# Patient Record
Sex: Female | Born: 1990 | Race: Black or African American | Hispanic: No | Marital: Single | Smoking: Current every day smoker
Health system: Southern US, Community
[De-identification: ages and names within clinical notes are randomized; demographics above are authoritative.]

## PROBLEM LIST (undated history)

## (undated) DIAGNOSIS — J45909 Unspecified asthma, uncomplicated: Secondary | ICD-10-CM

## (undated) HISTORY — PX: APPENDECTOMY: SHX54

## (undated) HISTORY — PX: MOUTH SURGERY: SHX715

---
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
## 2009-05-17 ENCOUNTER — Emergency Department (HOSPITAL_COMMUNITY): Admission: EM | Admit: 2009-05-17 | Discharge: 2009-05-17 | Payer: Self-pay | Admitting: Emergency Medicine

## 2010-01-19 ENCOUNTER — Emergency Department (HOSPITAL_COMMUNITY): Admission: EM | Admit: 2010-01-19 | Discharge: 2010-01-19 | Payer: Self-pay | Admitting: Emergency Medicine

## 2010-03-30 ENCOUNTER — Emergency Department (HOSPITAL_COMMUNITY)
Admission: EM | Admit: 2010-03-30 | Discharge: 2010-03-30 | Payer: Self-pay | Source: Home / Self Care | Admitting: Emergency Medicine

## 2010-06-17 LAB — URINALYSIS, ROUTINE W REFLEX MICROSCOPIC
Ketones, ur: NEGATIVE mg/dL
Leukocytes, UA: NEGATIVE
Nitrite: NEGATIVE
Protein, ur: NEGATIVE mg/dL

## 2010-06-17 LAB — URINE MICROSCOPIC-ADD ON

## 2010-06-17 LAB — DIFFERENTIAL
Basophils Absolute: 0.1 10*3/uL (ref 0.0–0.1)
Eosinophils Absolute: 0.1 10*3/uL (ref 0.0–0.7)
Eosinophils Relative: 2 % (ref 0–5)

## 2010-06-17 LAB — CBC
Hemoglobin: 12.7 g/dL (ref 12.0–15.0)
MCH: 29.3 pg (ref 26.0–34.0)
RBC: 4.34 MIL/uL (ref 3.87–5.11)

## 2010-06-17 LAB — COMPREHENSIVE METABOLIC PANEL
ALT: 15 U/L (ref 0–35)
AST: 23 U/L (ref 0–37)
CO2: 26 mEq/L (ref 19–32)
Chloride: 107 mEq/L (ref 96–112)
GFR calc Af Amer: >60 mL/min (ref >60)
GFR calc non Af Amer: >60 mL/min (ref >60)
Sodium: 142 mEq/L (ref 135–145)
Total Bilirubin: 0.5 mg/dL (ref 0.3–1.2)

## 2010-06-17 LAB — POCT PREGNANCY, URINE: Preg Test, Ur: NEGATIVE

## 2010-06-19 LAB — DIFFERENTIAL
Basophils Absolute: 0 10*3/uL (ref 0.0–0.1)
Eosinophils Relative: 0 % (ref 0–5)
Lymphocytes Relative: 11 % — ABNORMAL LOW (ref 12–46)
Lymphs Abs: 1.2 10*3/uL (ref 0.7–4.0)
Monocytes Absolute: 0.4 10*3/uL (ref 0.1–1.0)
Monocytes Relative: 4 % (ref 3–12)

## 2010-06-19 LAB — CBC
HCT: 39.1 % (ref 36.0–46.0)
Hemoglobin: 13.2 g/dL (ref 12.0–15.0)
MCV: 86.7 fL (ref 78.0–100.0)
RDW: 13 % (ref 11.5–15.5)
WBC: 10.2 10*3/uL (ref 4.0–10.5)

## 2010-06-19 LAB — URINALYSIS, ROUTINE W REFLEX MICROSCOPIC
Bilirubin Urine: NEGATIVE
Glucose, UA: NEGATIVE mg/dL
Hgb urine dipstick: NEGATIVE
Specific Gravity, Urine: 1.01 (ref 1.005–1.030)
pH: 8.5 — ABNORMAL HIGH (ref 5.0–8.0)

## 2010-06-19 LAB — BASIC METABOLIC PANEL
BUN: 8 mg/dL (ref 6–23)
Chloride: 106 mEq/L (ref 96–112)
Glucose, Bld: 110 mg/dL — ABNORMAL HIGH (ref 70–99)
Potassium: 4.2 mEq/L (ref 3.5–5.1)

## 2010-08-11 ENCOUNTER — Emergency Department (HOSPITAL_COMMUNITY)
Admission: EM | Admit: 2010-08-11 | Discharge: 2010-08-11 | Disposition: A | Discharge status: Home / Self Care | Payer: Self-pay | Attending: Emergency Medicine | Admitting: Emergency Medicine

## 2010-08-11 DIAGNOSIS — IMO0001 Reserved for inherently not codable concepts without codable children: Secondary | ICD-10-CM | POA: Insufficient documentation

## 2010-08-11 DIAGNOSIS — M549 Dorsalgia, unspecified: Secondary | ICD-10-CM | POA: Insufficient documentation

## 2011-01-08 LAB — HEPATIC FUNCTION PANEL
ALT: 15
AST: 22
Albumin: 4.2
Bilirubin, Direct: 0.1

## 2011-01-08 LAB — DIFFERENTIAL
Basophils Absolute: 0.1
Basophils Relative: 0
Lymphocytes Relative: 6 — ABNORMAL LOW
Monocytes Absolute: 0.7
Monocytes Relative: 4
Neutro Abs: 15.1 — ABNORMAL HIGH
Neutrophils Relative %: 90 — ABNORMAL HIGH

## 2011-01-08 LAB — BASIC METABOLIC PANEL
Calcium: 9.6
Creatinine, Ser: 0.82
GFR calc Af Amer: >60
GFR calc non Af Amer: >60
Sodium: 138

## 2011-01-08 LAB — URINALYSIS, ROUTINE W REFLEX MICROSCOPIC
Glucose, UA: NEGATIVE
Leukocytes, UA: NEGATIVE
Specific Gravity, Urine: >1.03 — ABNORMAL HIGH
pH: 6

## 2011-01-08 LAB — PREGNANCY, URINE: Preg Test, Ur: NEGATIVE

## 2011-01-08 LAB — URINE MICROSCOPIC-ADD ON

## 2011-01-08 LAB — CBC
Hemoglobin: 13
MCHC: 33.5
RBC: 4.56
RDW: 12.6

## 2012-04-07 ENCOUNTER — Encounter (HOSPITAL_COMMUNITY): Payer: Self-pay | Admitting: *Deleted

## 2012-04-07 ENCOUNTER — Inpatient Hospital Stay (HOSPITAL_COMMUNITY)
Admission: EM | Admit: 2012-04-07 | Discharge: 2012-04-10 | DRG: 343 | Disposition: A | Discharge status: Home / Self Care | Payer: MEDICAID | Attending: General Surgery | Admitting: General Surgery

## 2012-04-07 ENCOUNTER — Emergency Department (HOSPITAL_COMMUNITY): Payer: Self-pay

## 2012-04-07 ENCOUNTER — Emergency Department (HOSPITAL_COMMUNITY)
Admission: EM | Admit: 2012-04-07 | Discharge: 2012-04-07 | Disposition: A | Discharge status: Home / Self Care | Payer: Self-pay | Attending: Emergency Medicine | Admitting: Emergency Medicine

## 2012-04-07 DIAGNOSIS — Z3202 Encounter for pregnancy test, result negative: Secondary | ICD-10-CM | POA: Insufficient documentation

## 2012-04-07 DIAGNOSIS — F17200 Nicotine dependence, unspecified, uncomplicated: Secondary | ICD-10-CM | POA: Insufficient documentation

## 2012-04-07 DIAGNOSIS — R109 Unspecified abdominal pain: Secondary | ICD-10-CM

## 2012-04-07 DIAGNOSIS — K3580 Unspecified acute appendicitis: Principal | ICD-10-CM | POA: Diagnosis present

## 2012-04-07 DIAGNOSIS — R1011 Right upper quadrant pain: Secondary | ICD-10-CM | POA: Insufficient documentation

## 2012-04-07 DIAGNOSIS — R112 Nausea with vomiting, unspecified: Secondary | ICD-10-CM | POA: Insufficient documentation

## 2012-04-07 LAB — CBC WITH DIFFERENTIAL/PLATELET
Basophils Relative: 0 % (ref 0–1)
Hemoglobin: 10.5 g/dL — ABNORMAL LOW (ref 12.0–15.0)
Lymphs Abs: 0.5 10*3/uL — ABNORMAL LOW (ref 0.7–4.0)
MCHC: 33.9 g/dL (ref 30.0–36.0)
Monocytes Relative: 5 % (ref 3–12)
Neutro Abs: 14 10*3/uL — ABNORMAL HIGH (ref 1.7–7.7)
Neutrophils Relative %: 92 % — ABNORMAL HIGH (ref 43–77)
RBC: 3.64 MIL/uL — ABNORMAL LOW (ref 3.87–5.11)

## 2012-04-07 LAB — URINALYSIS, ROUTINE W REFLEX MICROSCOPIC
Bilirubin Urine: NEGATIVE
Glucose, UA: NEGATIVE mg/dL
Ketones, ur: NEGATIVE mg/dL
pH: 6 (ref 5.0–8.0)

## 2012-04-07 LAB — CBC
Hemoglobin: 12.8 g/dL (ref 12.0–15.0)
MCV: 85.2 fL (ref 78.0–100.0)
Platelets: 236 10*3/uL (ref 150–400)
RBC: 4.38 MIL/uL (ref 3.87–5.11)
WBC: 10.7 10*3/uL — ABNORMAL HIGH (ref 4.0–10.5)

## 2012-04-07 LAB — COMPREHENSIVE METABOLIC PANEL
ALT: 9 U/L (ref 0–35)
AST: 19 U/L (ref 0–37)
CO2: 26 mEq/L (ref 19–32)
Chloride: 102 mEq/L (ref 96–112)
Creatinine, Ser: 0.63 mg/dL (ref 0.50–1.10)
GFR calc non Af Amer: >90 mL/min (ref >90)
Sodium: 137 mEq/L (ref 135–145)
Total Bilirubin: 0.7 mg/dL (ref 0.3–1.2)

## 2012-04-07 LAB — URINE MICROSCOPIC-ADD ON

## 2012-04-07 MED ORDER — IOHEXOL 300 MG/ML  SOLN
100.0000 mL | Freq: Once | INTRAMUSCULAR | Status: AC | PRN
  Administered 2012-04-07: 100 mL via INTRAVENOUS

## 2012-04-07 MED ORDER — ONDANSETRON HCL 4 MG/2ML IJ SOLN
4.0000 mg | Freq: Once | INTRAMUSCULAR | Status: AC
  Administered 2012-04-07: 4 mg via INTRAVENOUS
  Filled 2012-04-07: qty 2

## 2012-04-07 MED ORDER — MORPHINE SULFATE 4 MG/ML IJ SOLN
6.0000 mg | Freq: Once | INTRAMUSCULAR | Status: AC
  Administered 2012-04-07: 6 mg via INTRAVENOUS
  Filled 2012-04-07: qty 2

## 2012-04-07 MED ORDER — SODIUM CHLORIDE 0.9 % IV SOLN
1000.0000 mL | Freq: Once | INTRAVENOUS | Status: AC
  Administered 2012-04-07: 1000 mL via INTRAVENOUS

## 2012-04-07 MED ORDER — PROMETHAZINE HCL 25 MG PO TABS: 25.0000 mg | ORAL_TABLET | Freq: Four times a day (QID) | ORAL | Status: DC | PRN

## 2012-04-07 MED ORDER — SODIUM CHLORIDE 0.9 % IV SOLN
3.0000 g | Freq: Four times a day (QID) | INTRAVENOUS | Status: DC
  Administered 2012-04-08 – 2012-04-09 (×8): 3 g via INTRAVENOUS
  Filled 2012-04-07 (×20): qty 3

## 2012-04-07 MED ORDER — SODIUM CHLORIDE 0.9 % IV SOLN
1000.0000 mL | INTRAVENOUS | Status: DC
  Administered 2012-04-07: 1000 mL via INTRAVENOUS

## 2012-04-07 MED ORDER — SODIUM CHLORIDE 0.9 % IV SOLN: 1000.0000 mL | INTRAVENOUS | Status: DC

## 2012-04-07 MED ORDER — HYDROCODONE-ACETAMINOPHEN 5-325 MG PO TABS: 1.0000 | ORAL_TABLET | ORAL | Status: DC | PRN

## 2012-04-07 MED ORDER — SODIUM CHLORIDE 0.9 % IV SOLN: Freq: Once | INTRAVENOUS | Status: AC

## 2012-04-07 NOTE — ED Notes (Signed)
abd pain , vomiting since last pm.  No diarrhea.

## 2012-04-07 NOTE — ED Provider Notes (Addendum)
History  This chart was scribed for Lyanne Co, MD by Sofie Rower, ED Scribe. The patient was seen in room APA12/[facility]12 and the patient's care was started at 8:41PM    CSN: [id number]  Arrival date & time 04/07/12  2026   First MD Initiated Contact with Patient 04/07/12 2041      Chief Complaint  Patient presents with  . Emesis     The history is provided by the patient.    Stacy Oconnell is a 22 y.o. female ,  returning to the Emergency Department after discharge from the ER earlier today.  She states she went home and attempted to try and eat something and became nauseated in her abdominal pain continued and that she return to the emergency department for evaluation.  Her abdominal pain began earlier today she's had nausea and vomiting since then.  No diarrhea or urinary symptoms.  Her symptoms are mild to moderate in severity this time.  She's never had pain like this before.       History reviewed. No pertinent past medical history.  History reviewed. No pertinent past surgical history.  History reviewed. No pertinent family history.  History  Substance Use Topics  . Smoking status: Current Every Day Smoker  . Smokeless tobacco: Not on file  . Alcohol Use: No    OB History    Grav Para Term Preterm Abortions TAB SAB Ect Mult Living                  Review of Systems  10 Systems reviewed and all are negative for acute change except as noted in the HPI.    Allergies  Review of patient's allergies indicates no known allergies.  Home Medications   Current Outpatient Rx  Name  Route  Sig  Dispense  Refill  . HYDROCODONE-ACETAMINOPHEN 5-325 MG PO TABS   Oral   Take 1 tablet by mouth every 4 (four) hours as needed for pain.   12 tablet   0   . IBUPROFEN 200 MG PO TABS   Oral   Take 800 mg by mouth every 6 (six) hours as needed. Pain         . PROMETHAZINE HCL 25 MG PO TABS   Oral   Take 1 tablet (25 mg total) by mouth every 6 (six) hours as needed  for nausea.   12 tablet   0     BP 92/55  Pulse 79  Temp 98.3 F (36.8 C) (Oral)  Resp 20  Ht 5\' 5"  (1.651 m)  Wt 100 lb (45.36 kg)  BMI 16.64 kg/m2  SpO2 100%  LMP 04/05/2012  Physical Exam  Nursing note and vitals reviewed. Constitutional: She is oriented to person, place, and time. She appears well-developed and well-nourished. No distress.  HENT:  Head: Normocephalic and atraumatic.  Eyes: EOM are normal.  Neck: Normal range of motion.  Cardiovascular: Normal rate, regular rhythm and normal heart sounds.   Pulmonary/Chest: Effort normal and breath sounds normal.  Abdominal: Soft. She exhibits no distension. There is tenderness in the right upper quadrant.       Mild periumbilical tenderness without guarding or rebound  Musculoskeletal: Normal range of motion.  Neurological: She is alert and oriented to person, place, and time.  Skin: Skin is warm and dry.  Psychiatric: She has a normal mood and affect. Judgment normal.    ED Course  Procedures (including critical care time)  DIAGNOSTIC STUDIES: Oxygen Saturation is 100%  on room air, normal by my interpretation.    COORDINATION OF CARE:  9:07 PM- Treatment plan discussed with patient. Pt agrees with treatment.      Labs Reviewed  CBC WITH DIFFERENTIAL - Abnormal; Notable for the following:    WBC 15.3 (*)     RBC 3.64 (*)     Hemoglobin 10.5 (*)  DELTA CHECK NOTED   HCT 31.0 (*)     Neutrophils Relative 92 (*)     Neutro Abs 14.0 (*)     Lymphocytes Relative 3 (*)     Lymphs Abs 0.5 (*)     All other components within normal limits   Ct Abdomen Pelvis W Contrast  04/07/2012  *RADIOLOGY REPORT*  Clinical Data: Vomiting, abdominal pain.  CT ABDOMEN AND PELVIS WITH CONTRAST  Technique:  Multidetector CT imaging of the abdomen and pelvis was performed following the standard protocol during bolus administration of intravenous contrast.  Contrast: OMNIPAQUE IOHEXOL 300 MG/ML  SOLN  Comparison: 12/16/2007   Findings: Lung bases are clear.  No effusions.  Heart is normal size.  There is mild periportal edema within the liver.  This is nonspecific but can be seen with inflammatory processes such as hepatitis.  No focal abnormality in the liver.  Gallbladder, stomach, spleen, pancreas, adrenals and kidneys are unremarkable. No hydronephrosis.  Uterus, adnexa and urinary bladder grossly unremarkable.  Trace free fluid in the pelvis, likely physiologic. Large and small bowel are grossly unremarkable.  Appendix is visualized. The proximal appendix is decompressed.  Mid to distal appendix is borderline in diameter at 7 to 8 mm, best seen on the coronal images.  No inflammatory changes around the appendix, but cannot exclude early appendicitis.  Mildly prominent right lower quadrant mesenteric lymph nodes.  Aorta is normal caliber.  No acute bony abnormality.  IMPRESSION: Mildly prominent mid to distal appendix.  Cannot exclude early appendicitis.  No current inflammatory change around the appendix. Recommend clinical correlation.  Mild periportal edema within the liver, a nonspecific finding. This can be related to inflammatory processes such as hepatitis. Recommend clinical correlation.   Original Report Authenticated By: Charlett Nose, M.D.    I personally reviewed the imaging tests through PACS system I reviewed available ER/hospitalization records through the EMR   No diagnosis found.    MDM  11:34 PM The patient's CT scan is concerning for early appendicitis.  Her white count is rising from 10,000 earlier today to 15,000 now.  I will ask the general surgeon to evaluate the patient in the emergency department.    I personally performed the services described in this documentation, which was scribed in my presence. The recorded information has been reviewed and is accurate.   11:56 PM Spoke with Dr Lovell Sheehan, [facility], agrees to admission. Will evaluate in the AM in the hospital for possible appendectomy. Holding  orders. NPO now. IV unasyn. PRN pain and nausea medicine.    Lyanne Co, MD 04/07/12 [id number]  Lyanne Co, MD 04/07/12 [phone]

## 2012-04-07 NOTE — ED Provider Notes (Signed)
History   This chart was scribed for Lyanne Co, MD by Sofie Rower, ED Scribe. The patient was seen in room APA19/APA19 and the patient's care was started at 3:36PM    CSN: [id number]  Arrival date & time 04/07/12  1500   First MD Initiated Contact with Patient 04/07/12 1536      Chief Complaint  Patient presents with  . Abdominal Pain    (Consider location/radiation/quality/duration/timing/severity/associated sxs/prior treatment) The history is provided by the patient.   Stacy Oconnell is a 22 y.o. female , with a hx of abdominal pain, who presents to the Emergency Department complaining of sudden, progressively worsening, abdominal pain located at the epigastrium and RUQ, onset yesterday (04/06/12 at 10:00PM)  Associated symptoms include nausea and vomiting (multiple episodes, onset today 04/07/12). The pt reports her grandmother passed away at [facility] yesterday evening, where shortly thereafter, she began to experience discomfort within her abdomen. The pt informs she awoke this morning experiencing several episodes of nausea and vomiting, which prompted her concern to seek medical evaluation at [facility] this evening.  The pt is a current everyday smoker, however, she does not drink alcohol.     History reviewed. No pertinent past medical history.  History reviewed. No pertinent past surgical history.  History reviewed. No pertinent family history.  History  Substance Use Topics  . Smoking status: Current Every Day Smoker  . Smokeless tobacco: Not on file  . Alcohol Use: No    OB History    Grav Para Term Preterm Abortions TAB SAB Ect Mult Living                  Review of Systems  10 Systems reviewed and all are negative for acute change except as noted in the HPI.    Allergies  Review of patient's allergies indicates no known allergies.  Home Medications   Current Outpatient Rx  Name  Route  Sig  Dispense  Refill  . IBUPROFEN 200 MG PO TABS  Oral   Take 800 mg by mouth every 6 (six) hours as needed. Pain           BP 106/68  Pulse 83  Temp 97.8 F (36.6 C) (Oral)  Resp 18  Ht 5\' 5"  (1.651 m)  Wt 100 lb (45.36 kg)  BMI 16.64 kg/m2  SpO2 100%  LMP 04/05/2012  Physical Exam  Nursing note and vitals reviewed. Constitutional: She is oriented to person, place, and time. She appears well-developed and well-nourished. No distress.  HENT:  Head: Normocephalic and atraumatic.  Eyes: EOM are normal.  Neck: Normal range of motion.  Cardiovascular: Normal rate, regular rhythm and normal heart sounds.   Pulmonary/Chest: Effort normal and breath sounds normal.  Abdominal: Soft. She exhibits no distension. There is tenderness in the right upper quadrant.       Mild epigastric discomfort. Mild RUQ tenderness.  Musculoskeletal: Normal range of motion.  Neurological: She is alert and oriented to person, place, and time.  Skin: Skin is warm and dry.  Psychiatric: She has a normal mood and affect. Judgment normal.    ED Course  Procedures (including critical care time)  DIAGNOSTIC STUDIES: Oxygen Saturation is 100% on room air, normal by my interpretation.    COORDINATION OF CARE:  3:39 PM- Treatment plan discussed with patient. Pt agrees with treatment.  5:08 PM- Recheck. Pt feeling better at this time. Treatment plan discussed with patient. Pt agrees with treatment.  Labs Reviewed  CBC - Abnormal; Notable for the following:    WBC 10.7 (*)     All other components within normal limits  COMPREHENSIVE METABOLIC PANEL - Abnormal; Notable for the following:    Potassium 3.2 (*)     Total Protein 8.4 (*)     All other components within normal limits  URINALYSIS, ROUTINE W REFLEX MICROSCOPIC - Abnormal; Notable for the following:    Specific Gravity, Urine >1.030 (*)     Hgb urine dipstick LARGE (*)     All other components within normal limits  URINE MICROSCOPIC-ADD ON - Abnormal; Notable for the following:     Squamous Epithelial / LPF FEW (*)     All other components within normal limits  LIPASE, BLOOD  PREGNANCY, URINE    Results for orders placed during the hospital encounter of 04/07/12  CBC      Component Value Range   WBC 10.7 (*) 4.0 - 10.5 K/uL   RBC 4.38  3.87 - 5.11 MIL/uL   Hemoglobin 12.8  12.0 - 15.0 g/dL   HCT 11.9  14.7 - 82.9 %   MCV 85.2  78.0 - 100.0 fL   MCH 29.2  26.0 - 34.0 pg   MCHC 34.3  30.0 - 36.0 g/dL   RDW 56.2  13.0 - 86.5 %   Platelets 236  150 - 400 K/uL  COMPREHENSIVE METABOLIC PANEL      Component Value Range   Sodium 137  135 - 145 mEq/L   Potassium 3.2 (*) 3.5 - 5.1 mEq/L   Chloride 102  96 - 112 mEq/L   CO2 26  19 - 32 mEq/L   Glucose, Bld 88  70 - 99 mg/dL   BUN 8  6 - 23 mg/dL   Creatinine, Ser 7.84  0.50 - 1.10 mg/dL   Calcium 9.6  8.4 - 69.6 mg/dL   Total Protein 8.4 (*) 6.0 - 8.3 g/dL   Albumin 4.4  3.5 - 5.2 g/dL   AST 19  0 - 37 U/L   ALT 9  0 - 35 U/L   Alkaline Phosphatase 59  39 - 117 U/L   Total Bilirubin 0.7  0.3 - 1.2 mg/dL   GFR calc non Af Amer >90  >90 mL/min   GFR calc Af Amer >90  >90 mL/min  LIPASE, BLOOD      Component Value Range   Lipase 28  11 - 59 U/L  PREGNANCY, URINE      Component Value Range   Preg Test, Ur NEGATIVE  NEGATIVE  URINALYSIS, ROUTINE W REFLEX MICROSCOPIC      Component Value Range   Color, Urine YELLOW  YELLOW   APPearance CLEAR  CLEAR   Specific Gravity, Urine >1.030 (*) 1.005 - 1.030   pH 6.0  5.0 - 8.0   Glucose, UA NEGATIVE  NEGATIVE mg/dL   Hgb urine dipstick LARGE (*) NEGATIVE   Bilirubin Urine NEGATIVE  NEGATIVE   Ketones, ur NEGATIVE  NEGATIVE mg/dL   Protein, ur NEGATIVE  NEGATIVE mg/dL   Urobilinogen, UA 0.2  0.0 - 1.0 mg/dL   Nitrite NEGATIVE  NEGATIVE   Leukocytes, UA NEGATIVE  NEGATIVE  URINE MICROSCOPIC-ADD ON      Component Value Range   Squamous Epithelial / LPF FEW (*) RARE   WBC, UA 0-2  <3 WBC/hpf   RBC / HPF 0-2  <3 RBC/hpf   Urine-Other MUCOUS PRESENT  1. Nausea & vomiting   2. Abdominal pain       MDM  5:10 PM The patient feels much better at this time.  Abdominal exam is benign.  Labs and urine without significant abnormality.  Doubt pancreatitis.  Doubt cholecystitis.  Discharge home in good condition.  She understands return to the ER for new or worsening symptoms.      I personally performed the services described in this documentation, which was scribed in my presence. The recorded information has been reviewed and is accurate.      Lyanne Co, MD 04/07/12 [phone]

## 2012-04-07 NOTE — ED Notes (Signed)
Seen here earlier to day for same,  Vomiting, abd pain.  Says she is no better.  Did not get rx for vomiting

## 2012-04-08 ENCOUNTER — Encounter (HOSPITAL_COMMUNITY): Payer: Self-pay | Admitting: Urology

## 2012-04-08 MED ORDER — ONDANSETRON HCL 4 MG/2ML IJ SOLN: 4.0000 mg | Freq: Four times a day (QID) | INTRAMUSCULAR | Status: AC | PRN

## 2012-04-08 MED ORDER — HYDROMORPHONE HCL PF 1 MG/ML IJ SOLN
0.5000 mg | INTRAMUSCULAR | Status: DC | PRN
  Administered 2012-04-08 (×2): 0.5 mg via INTRAVENOUS
  Filled 2012-04-08 (×2): qty 1

## 2012-04-08 MED ORDER — PANTOPRAZOLE SODIUM 40 MG IV SOLR
40.0000 mg | Freq: Every day | INTRAVENOUS | Status: DC
  Administered 2012-04-08: 40 mg via INTRAVENOUS
  Filled 2012-04-08: qty 40

## 2012-04-08 MED ORDER — AMPICILLIN-SULBACTAM SODIUM 3 (2-1) G IJ SOLR
INTRAMUSCULAR | Status: AC
  Filled 2012-04-08: qty 3

## 2012-04-08 MED ORDER — KCL IN DEXTROSE-NACL 40-5-0.45 MEQ/L-%-% IV SOLN
INTRAVENOUS | Status: DC
  Administered 2012-04-08 – 2012-04-09 (×2): via INTRAVENOUS

## 2012-04-08 MED ORDER — HYDROMORPHONE HCL PF 1 MG/ML IJ SOLN
0.5000 mg | INTRAMUSCULAR | Status: AC | PRN
  Administered 2012-04-08 (×3): 0.5 mg via INTRAVENOUS
  Filled 2012-04-08 (×3): qty 1

## 2012-04-08 MED ORDER — SODIUM CHLORIDE 0.9 % IV SOLN
INTRAVENOUS | Status: AC
  Administered 2012-04-08: 01:00:00 via INTRAVENOUS

## 2012-04-08 MED ORDER — DIPHENHYDRAMINE HCL 50 MG/ML IJ SOLN: 12.5000 mg | Freq: Four times a day (QID) | INTRAMUSCULAR | Status: DC | PRN

## 2012-04-08 MED ORDER — DIPHENHYDRAMINE HCL 12.5 MG/5ML PO ELIX: 12.5000 mg | ORAL_SOLUTION | Freq: Four times a day (QID) | ORAL | Status: DC | PRN

## 2012-04-08 NOTE — H&P (Signed)
Stacy Oconnell is an 22 y.o. female.   Chief Complaint: Abdominal pain, rule out appendicitis HPI: Patient is a 22 year old black female who presented emergency room yesterday morning with periumbilical abdominal pain. She was discharged home and returned approximately 5 hours later was found to have an increase in her white blood cell count of 15,000. A CT scan of the abdomen and pelvis was performed which revealed possible early appendicitis. She also had some. Hepatic edema. She was admitted to the hospital for further evaluation and treatment. This morning, she is hungry and denies any right lower quadrant abdominal pain. She points to the epigastric region as somewhat painful. She denies any diarrhea or constipation. She states she feels better.  History reviewed. No pertinent past medical history.  History reviewed. No pertinent past surgical history.  History reviewed. No pertinent family history. Social History:  reports that she has been smoking.  She does not have any smokeless tobacco history on file. She reports that she does not drink alcohol. Her drug history not on file.  Allergies: No Known Allergies  Medications Prior to Admission  Medication Sig Dispense Refill  . HYDROcodone-acetaminophen (NORCO/VICODIN) 5-325 MG per tablet Take 1 tablet by mouth every 4 (four) hours as needed for pain.  12 tablet  0  . ibuprofen (ADVIL,MOTRIN) 200 MG tablet Take 800 mg by mouth every 6 (six) hours as needed. Pain        Results for orders placed during the hospital encounter of 04/07/12 (from the past 48 hour(s))  CBC WITH DIFFERENTIAL     Status: Abnormal   Collection Time   04/07/12 11:10 PM      Component Value Range Comment   WBC 15.3 (*) 4.0 - 10.5 K/uL    RBC 3.64 (*) 3.87 - 5.11 MIL/uL    Hemoglobin 10.5 (*) 12.0 - 15.0 g/dL DELTA CHECK NOTED   HCT 31.0 (*) 36.0 - 46.0 %    MCV 85.2  78.0 - 100.0 fL    MCH 28.8  26.0 - 34.0 pg    MCHC 33.9  30.0 - 36.0 g/dL    RDW 16.1  09.6  - 04.5 %    Platelets 175  150 - 400 K/uL DELTA CHECK NOTED   Neutrophils Relative 92 (*) 43 - 77 %    Neutro Abs 14.0 (*) 1.7 - 7.7 K/uL    Lymphocytes Relative 3 (*) 12 - 46 %    Lymphs Abs 0.5 (*) 0.7 - 4.0 K/uL    Monocytes Relative 5  3 - 12 %    Monocytes Absolute 0.8  0.1 - 1.0 K/uL    Eosinophils Relative 0  0 - 5 %    Eosinophils Absolute 0.0  0.0 - 0.7 K/uL    Basophils Relative 0  0 - 1 %    Basophils Absolute 0.0  0.0 - 0.1 K/uL    Ct Abdomen Pelvis W Contrast  04/07/2012  *RADIOLOGY REPORT*  Clinical Data: Vomiting, abdominal pain.  CT ABDOMEN AND PELVIS WITH CONTRAST  Technique:  Multidetector CT imaging of the abdomen and pelvis was performed following the standard protocol during bolus administration of intravenous contrast.  Contrast: OMNIPAQUE IOHEXOL 300 MG/ML  SOLN  Comparison: 12/16/2007  Findings: Lung bases are clear.  No effusions.  Heart is normal size.  There is mild periportal edema within the liver.  This is nonspecific but can be seen with inflammatory processes such as hepatitis.  No focal abnormality in the liver.  Gallbladder, stomach, spleen, pancreas, adrenals and kidneys are unremarkable. No hydronephrosis.  Uterus, adnexa and urinary bladder grossly unremarkable.  Trace free fluid in the pelvis, likely physiologic. Large and small bowel are grossly unremarkable.  Appendix is visualized. The proximal appendix is decompressed.  Mid to distal appendix is borderline in diameter at 7 to 8 mm, best seen on the coronal images.  No inflammatory changes around the appendix, but cannot exclude early appendicitis.  Mildly prominent right lower quadrant mesenteric lymph nodes.  Aorta is normal caliber.  No acute bony abnormality.  IMPRESSION: Mildly prominent mid to distal appendix.  Cannot exclude early appendicitis.  No current inflammatory change around the appendix. Recommend clinical correlation.  Mild periportal edema within the liver, a nonspecific finding. This can  be related to inflammatory processes such as hepatitis. Recommend clinical correlation.   Original Report Authenticated By: Charlett Nose, M.D.     Review of Systems  Constitutional: Positive for malaise/fatigue.  HENT: Negative.   Eyes: Negative.   Respiratory: Negative.   Cardiovascular: Negative.   Gastrointestinal: Positive for abdominal pain. Negative for nausea, vomiting and diarrhea.  Genitourinary: Negative.   Musculoskeletal: Negative.   Skin: Negative.   Neurological: Negative.   Endo/Heme/Allergies: Negative.   Psychiatric/Behavioral: Negative.     Blood pressure 93/57, pulse 75, temperature 98.4 F (36.9 C), temperature source Oral, resp. rate 19, height 5\' 5"  (1.651 m), weight 45.6 kg (100 lb 8.5 oz), last menstrual period 04/05/2012, SpO2 98.00%. Physical Exam  Constitutional: She is oriented to person, place, and time. She appears well-developed and well-nourished.  HENT:  Head: Normocephalic and atraumatic.  Neck: Normal range of motion. Neck supple.  Cardiovascular: Normal rate, regular rhythm and normal heart sounds.   Respiratory: Effort normal and breath sounds normal.  GI: Soft. Bowel sounds are normal. She exhibits no distension. There is no rebound and no guarding.       Nonspecific abdominal tenderness, mild, epigastric region.  No specific right lower quadrant abdominal pain to deep palpation.  No rigidity noted.  Neurological: She is alert and oriented to person, place, and time.  Skin: Skin is warm and dry.     Assessment/Plan Impression: Abdominal pain of unknown etiology. Clinically, she has not progressed to acute appendicitis at this time. Will continue IV Unasyn for now. Will advance to regular diet. Will continue to monitor over the next 24 hours.  Kerby Hockley A 04/08/2012, 8:24 AM

## 2012-04-08 NOTE — Progress Notes (Signed)
UR Chart Review Completed  

## 2012-04-09 ENCOUNTER — Encounter (HOSPITAL_COMMUNITY): Payer: Self-pay | Admitting: Anesthesiology

## 2012-04-09 ENCOUNTER — Observation Stay (HOSPITAL_COMMUNITY): Payer: MEDICAID | Admitting: Anesthesiology

## 2012-04-09 ENCOUNTER — Encounter (HOSPITAL_COMMUNITY)
Admission: EM | Disposition: A | Discharge status: Home / Self Care | Payer: Self-pay | Source: Home / Self Care | Attending: General Surgery

## 2012-04-09 ENCOUNTER — Encounter (HOSPITAL_COMMUNITY): Payer: Self-pay | Admitting: *Deleted

## 2012-04-09 HISTORY — PX: LAPAROSCOPIC APPENDECTOMY: SHX408

## 2012-04-09 LAB — CBC
Hemoglobin: 9.5 g/dL — ABNORMAL LOW (ref 12.0–15.0)
MCH: 29.1 pg (ref 26.0–34.0)
MCV: 85.6 fL (ref 78.0–100.0)
RBC: 3.26 MIL/uL — ABNORMAL LOW (ref 3.87–5.11)

## 2012-04-09 LAB — BASIC METABOLIC PANEL
CO2: 25 mEq/L (ref 19–32)
Glucose, Bld: 89 mg/dL (ref 70–99)
Potassium: 3.7 mEq/L (ref 3.5–5.1)
Sodium: 135 mEq/L (ref 135–145)

## 2012-04-09 SURGERY — APPENDECTOMY, LAPAROSCOPIC
Anesthesia: General | Site: Abdomen | Wound class: Contaminated

## 2012-04-09 MED ORDER — LIDOCAINE HCL (CARDIAC) 20 MG/ML IV SOLN
INTRAVENOUS | Status: DC | PRN
  Administered 2012-04-09: 30 mg via INTRAVENOUS

## 2012-04-09 MED ORDER — PROPOFOL 10 MG/ML IV BOLUS
INTRAVENOUS | Status: DC | PRN
  Administered 2012-04-09: 120 mg via INTRAVENOUS

## 2012-04-09 MED ORDER — BUPIVACAINE HCL (PF) 0.5 % IJ SOLN
INTRAMUSCULAR | Status: DC | PRN
  Administered 2012-04-09: 7 mL

## 2012-04-09 MED ORDER — GLYCOPYRROLATE 0.2 MG/ML IJ SOLN
INTRAMUSCULAR | Status: DC | PRN
  Administered 2012-04-09: .5 mg via INTRAVENOUS

## 2012-04-09 MED ORDER — FENTANYL CITRATE 0.05 MG/ML IJ SOLN
25.0000 ug | INTRAMUSCULAR | Status: DC | PRN
  Administered 2012-04-09: 50 ug via INTRAVENOUS

## 2012-04-09 MED ORDER — GLYCOPYRROLATE 0.2 MG/ML IJ SOLN
INTRAMUSCULAR | Status: AC
  Filled 2012-04-09: qty 3

## 2012-04-09 MED ORDER — PROPOFOL 10 MG/ML IV EMUL
INTRAVENOUS | Status: AC
  Filled 2012-04-09: qty 20

## 2012-04-09 MED ORDER — MIDAZOLAM HCL 2 MG/2ML IJ SOLN
1.0000 mg | INTRAMUSCULAR | Status: DC | PRN
  Administered 2012-04-09 (×2): 2 mg via INTRAVENOUS

## 2012-04-09 MED ORDER — ONDANSETRON HCL 4 MG/2ML IJ SOLN: 4.0000 mg | Freq: Four times a day (QID) | INTRAMUSCULAR | Status: DC | PRN

## 2012-04-09 MED ORDER — MIDAZOLAM HCL 2 MG/2ML IJ SOLN
INTRAMUSCULAR | Status: AC
  Filled 2012-04-09: qty 2

## 2012-04-09 MED ORDER — CHLORHEXIDINE GLUCONATE 4 % EX LIQD
1.0000 "application " | Freq: Once | CUTANEOUS | Status: AC
  Administered 2012-04-09: 1 via TOPICAL

## 2012-04-09 MED ORDER — NEOSTIGMINE METHYLSULFATE 1 MG/ML IJ SOLN
INTRAMUSCULAR | Status: DC | PRN
  Administered 2012-04-09: 2.5 mg via INTRAVENOUS

## 2012-04-09 MED ORDER — HYDROMORPHONE HCL PF 1 MG/ML IJ SOLN
0.5000 mg | INTRAMUSCULAR | Status: DC | PRN
  Administered 2012-04-09: 0.5 mg via INTRAVENOUS
  Filled 2012-04-09: qty 1

## 2012-04-09 MED ORDER — PANTOPRAZOLE SODIUM 40 MG PO TBEC: 40.0000 mg | DELAYED_RELEASE_TABLET | Freq: Every day | ORAL | Status: DC

## 2012-04-09 MED ORDER — LIDOCAINE HCL (PF) 1 % IJ SOLN
INTRAMUSCULAR | Status: AC
  Filled 2012-04-09: qty 5

## 2012-04-09 MED ORDER — HYDROMORPHONE HCL PF 1 MG/ML IJ SOLN
1.0000 mg | INTRAMUSCULAR | Status: DC | PRN
  Administered 2012-04-09 – 2012-04-10 (×5): 1 mg via INTRAVENOUS
  Filled 2012-04-09 (×5): qty 1

## 2012-04-09 MED ORDER — ACETAMINOPHEN 10 MG/ML IV SOLN
1000.0000 mg | Freq: Four times a day (QID) | INTRAVENOUS | Status: DC
  Administered 2012-04-09 – 2012-04-10 (×3): 1000 mg via INTRAVENOUS
  Filled 2012-04-09 (×3): qty 100

## 2012-04-09 MED ORDER — ONDANSETRON HCL 4 MG/2ML IJ SOLN: 4.0000 mg | Freq: Once | INTRAMUSCULAR | Status: DC | PRN

## 2012-04-09 MED ORDER — FENTANYL CITRATE 0.05 MG/ML IJ SOLN
INTRAMUSCULAR | Status: AC
  Filled 2012-04-09: qty 2

## 2012-04-09 MED ORDER — ROCURONIUM BROMIDE 50 MG/5ML IV SOLN
INTRAVENOUS | Status: AC
  Filled 2012-04-09: qty 1

## 2012-04-09 MED ORDER — FENTANYL CITRATE 0.05 MG/ML IJ SOLN
INTRAMUSCULAR | Status: DC | PRN
  Administered 2012-04-09 (×5): 50 ug via INTRAVENOUS

## 2012-04-09 MED ORDER — SODIUM CHLORIDE 0.9 % IV SOLN
3.0000 g | Freq: Four times a day (QID) | INTRAVENOUS | Status: DC
  Administered 2012-04-09 – 2012-04-10 (×3): 3 g via INTRAVENOUS
  Filled 2012-04-09 (×12): qty 3

## 2012-04-09 MED ORDER — ONDANSETRON HCL 4 MG PO TABS: 4.0000 mg | ORAL_TABLET | Freq: Four times a day (QID) | ORAL | Status: DC | PRN

## 2012-04-09 MED ORDER — ENOXAPARIN SODIUM 40 MG/0.4ML ~~LOC~~ SOLN
40.0000 mg | Freq: Once | SUBCUTANEOUS | Status: AC
  Administered 2012-04-09: 40 mg via SUBCUTANEOUS
  Filled 2012-04-09: qty 0.4

## 2012-04-09 MED ORDER — FENTANYL CITRATE 0.05 MG/ML IJ SOLN
INTRAMUSCULAR | Status: AC
  Filled 2012-04-09: qty 5

## 2012-04-09 MED ORDER — ONDANSETRON HCL 4 MG/2ML IJ SOLN
INTRAMUSCULAR | Status: AC
  Filled 2012-04-09: qty 2

## 2012-04-09 MED ORDER — ACETAMINOPHEN 10 MG/ML IV SOLN
INTRAVENOUS | Status: AC
  Filled 2012-04-09: qty 100

## 2012-04-09 MED ORDER — ROCURONIUM BROMIDE 100 MG/10ML IV SOLN
INTRAVENOUS | Status: DC | PRN
  Administered 2012-04-09: 2 mg via INTRAVENOUS

## 2012-04-09 MED ORDER — LACTATED RINGERS IV SOLN
INTRAVENOUS | Status: DC
  Administered 2012-04-10: 03:00:00 via INTRAVENOUS

## 2012-04-09 MED ORDER — LACTATED RINGERS IV SOLN
INTRAVENOUS | Status: DC
  Administered 2012-04-09: 13:00:00 via INTRAVENOUS
  Administered 2012-04-09: 1000 mL via INTRAVENOUS

## 2012-04-09 MED ORDER — ENOXAPARIN SODIUM 40 MG/0.4ML ~~LOC~~ SOLN: 40.0000 mg | SUBCUTANEOUS | Status: DC

## 2012-04-09 MED ORDER — BUPIVACAINE HCL (PF) 0.5 % IJ SOLN
INTRAMUSCULAR | Status: AC
  Filled 2012-04-09: qty 30

## 2012-04-09 MED ORDER — SODIUM CHLORIDE 0.9 % IR SOLN
Status: DC | PRN
  Administered 2012-04-09: 1000 mL

## 2012-04-09 MED ORDER — MUPIROCIN 2 % EX OINT
TOPICAL_OINTMENT | CUTANEOUS | Status: AC
  Filled 2012-04-09: qty 22

## 2012-04-09 MED ORDER — ONDANSETRON HCL 4 MG/2ML IJ SOLN
4.0000 mg | Freq: Once | INTRAMUSCULAR | Status: AC
  Administered 2012-04-09: 4 mg via INTRAVENOUS

## 2012-04-09 MED ORDER — GLYCOPYRROLATE 0.2 MG/ML IJ SOLN
INTRAMUSCULAR | Status: AC
  Filled 2012-04-09: qty 1

## 2012-04-09 MED ORDER — GLYCOPYRROLATE 0.2 MG/ML IJ SOLN
0.2000 mg | Freq: Once | INTRAMUSCULAR | Status: AC
  Administered 2012-04-09: 0.2 mg via INTRAVENOUS

## 2012-04-09 SURGICAL SUPPLY — 47 items
BAG HAMPER (MISCELLANEOUS) ×2 IMPLANT
BAG SPEC RTRVL LRG 6X4 10 (ENDOMECHANICALS) ×1
CLOTH BEACON ORANGE TIMEOUT ST (SAFETY) ×2 IMPLANT
COVER LIGHT HANDLE STERIS (MISCELLANEOUS) ×4 IMPLANT
CUTTER ENDO LINEAR 45M (STAPLE) IMPLANT
CUTTER LINEAR ENDO 35 ETS (STAPLE) IMPLANT
CUTTER LINEAR ENDO 35 ETS TH (STAPLE) ×1 IMPLANT
DECANTER SPIKE VIAL GLASS SM (MISCELLANEOUS) IMPLANT
DISSECTOR BLUNT TIP ENDO 5MM (MISCELLANEOUS) IMPLANT
DURAPREP 26ML APPLICATOR (WOUND CARE) ×2 IMPLANT
ELECT REM PT RETURN 9FT ADLT (ELECTROSURGICAL) ×2
ELECTRODE REM PT RTRN 9FT ADLT (ELECTROSURGICAL) ×1 IMPLANT
FILTER SMOKE EVAC LAPAROSHD (FILTER) ×2 IMPLANT
FORMALIN 10 PREFIL 120ML (MISCELLANEOUS) ×2 IMPLANT
GLOVE BIO SURGEON STRL SZ7.5 (GLOVE) ×2 IMPLANT
GLOVE BIOGEL PI IND STRL 7.0 (GLOVE) IMPLANT
GLOVE BIOGEL PI INDICATOR 7.0 (GLOVE) ×1
GLOVE ECLIPSE 6.5 STRL STRAW (GLOVE) ×1 IMPLANT
GOWN STRL REIN XL XLG (GOWN DISPOSABLE) ×4 IMPLANT
INST SET LAPROSCOPIC AP (KITS) ×2 IMPLANT
IV NS IRRIG 3000ML ARTHROMATIC (IV SOLUTION) ×2 IMPLANT
KIT ROOM TURNOVER APOR (KITS) ×2 IMPLANT
MANIFOLD NEPTUNE II (INSTRUMENTS) ×2 IMPLANT
NDL INSUFFLATION 14GA 120MM (NEEDLE) ×1 IMPLANT
NEEDLE INSUFFLATION 14GA 120MM (NEEDLE) ×2 IMPLANT
NS IRRIG 1000ML POUR BTL (IV SOLUTION) ×2 IMPLANT
PACK LAP CHOLE LZT030E (CUSTOM PROCEDURE TRAY) ×2 IMPLANT
PAD ARMBOARD 7.5X6 YLW CONV (MISCELLANEOUS) ×2 IMPLANT
PENCIL HANDSWITCHING (ELECTRODE) ×1 IMPLANT
POUCH SPECIMEN RETRIEVAL 10MM (ENDOMECHANICALS) ×2 IMPLANT
RELOAD /EVU35 (ENDOMECHANICALS) IMPLANT
RELOAD 45 VASCULAR/THIN (ENDOMECHANICALS) IMPLANT
RELOAD CUTTER ETS 35MM STAND (ENDOMECHANICALS) IMPLANT
RELOAD STAPLE 45 2.5 WHT GRN (ENDOMECHANICALS) IMPLANT
SCALPEL HARMONIC ACE (MISCELLANEOUS) ×2 IMPLANT
SET BASIN LINEN APH (SET/KITS/TRAYS/PACK) ×2 IMPLANT
SET TUBE IRRIG SUCTION NO TIP (IRRIGATION / IRRIGATOR) ×1 IMPLANT
SPONGE GAUZE 2X2 8PLY STRL LF (GAUZE/BANDAGES/DRESSINGS) ×4 IMPLANT
STAPLER VISISTAT (STAPLE) ×2 IMPLANT
SUT VICRYL 0 UR6 27IN ABS (SUTURE) ×2 IMPLANT
TAPE CLOTH SURG 4X10 WHT LF (GAUZE/BANDAGES/DRESSINGS) ×1 IMPLANT
TRAY FOLEY CATH 14FR (SET/KITS/TRAYS/PACK) ×2 IMPLANT
TROCAR Z-THAD FIOS HNDL 12X100 (TROCAR) ×2 IMPLANT
TROCAR Z-THRD FIOS HNDL 11X100 (TROCAR) ×2 IMPLANT
TROCAR Z-THREAD FIOS 5X100MM (TROCAR) ×2 IMPLANT
WARMER LAPAROSCOPE (MISCELLANEOUS) ×2 IMPLANT
YANKAUER SUCT BULB TIP 10FT TU (MISCELLANEOUS) ×2 IMPLANT

## 2012-04-09 NOTE — Progress Notes (Signed)
Patient complaining of abdomen surgical pain, not relieved with 0.5mg  Dilaudid. Dr. Lovell Sheehan notified. New order for Dilaudid 1mg  q2hrs.

## 2012-04-09 NOTE — Op Note (Signed)
Patient:  Stacy Oconnell  DOB:  10-Nov-1990  MRN:  [id number]   Preop Diagnosis:  Acute appendicitis  Postop Diagnosis:  Same  Procedure:  Laparoscopic appendectomy  Surgeon:  Franky Macho, M.D.  Anes:  General endotracheal  Indications:  Patient is a 22 year old black female who presents with worsening right lower quadrant abdominal pain. The risks and benefits of the procedure including bleeding, infection, and the possibility of an open procedure were fully explained to the patient, gave informed consent.  Procedure note:  The patient was placed in the supine position. After induction of general endotracheal anesthesia, the abdomen was prepped and draped using usual sterile technique with DuraPrep. Surgical site confirmation was performed.  A supraumbilical incision was made down to the fascia. A Veress needle was introduced into the abdominal cavity and confirmation of placement was done using the saline drop test. The abdomen was then insufflated to 16 mm mercury pressure. An 11 mm trocar was introduced into the abdominal cavity and direct visualization the difficulty. The patient was placed in deeper Trendelenburg position and an additional 12 mm trocar was placed the suprapubic region and a 5 mm trocar was placed left lower quadrant region. The appendix was visualized in its distal half noted to be injected and swollen. The mesoappendix was divided using the harmonic scalpel. A vascular Endo GIA was placed across the base the appendix and fired. The appendix was then removed using an Endo Catch bag without difficulty. He was sent to pathology for further examination. The staple line was inspected and noted to be within normal limits. All fluid and air were then evacuated from the abdominal cavity prior to removal of the trochars.  All wounds were irrigated with normal saline. All wounds were injected with 0.5% Sensorcaine. The supraumbilical fascia as well as suprapubic fascia were  reapproximated using 0 Vicryl interrupted sutures. All skin incisions were closed using staples. Betadine ointment after dressings were applied.  All tape and needle counts were correct at the end of the procedure. The patient was extubated in the operating room and transferred to PACU in stable condition.  Complications:  None  EBL:  Minimal  Specimen:  Appendix

## 2012-04-09 NOTE — Progress Notes (Signed)
UR Chart Review Completed  

## 2012-04-09 NOTE — Anesthesia Postprocedure Evaluation (Signed)
  Anesthesia Post-op Note  Patient: Stacy Oconnell  Procedure(s) Performed: Procedure(s) (LRB) with comments: APPENDECTOMY LAPAROSCOPIC (N/A)  Patient Location: PACU  Anesthesia Type:General  Level of Consciousness: awake, alert  and oriented  Airway and Oxygen Therapy: Patient Spontanous Breathing and Patient connected to face mask oxygen  Post-op Pain: none  Post-op Assessment: Post-op Vital signs reviewed, Patient's Cardiovascular Status Stable, Respiratory Function Stable, Patent Airway and No signs of Nausea or vomiting  Post-op Vital Signs: Reviewed and stable  Complications: No apparent anesthesia complications

## 2012-04-09 NOTE — Progress Notes (Signed)
Subjective: Patient feels worse this morning than yesterday. Her pain seems to be localizing to the lower abdomen.  Objective: Vital signs in last 24 hours: Temp:  [97.9 F (36.6 C)-98.3 F (36.8 C)] 98.3 F (36.8 C) (01/03 0611) Pulse Rate:  [61-79] 61  (01/03 0611) Resp:  [16-18] 17  (01/03 0611) BP: (96-105)/(54-73) 98/54 mmHg (01/03 0611) SpO2:  [100 %] 100 % (01/03 0611) Last BM Date: 04/06/12  Intake/Output from previous day: 01/02 0701 - 01/03 0700 In: 800 [P.O.:800] Out: 950 [Urine:950] Intake/Output this shift:    General appearance: alert and cooperative Resp: clear to auscultation bilaterally Cardio: regular rate and rhythm, S1, S2 normal, no murmur, click, rub or gallop GI: Soft, flat. Tender in lower abdomen, especially right lower cautery. No rigidity noted.  Lab Results:   Basename 04/09/12 0519 04/07/12 2310  WBC 8.5 15.3*  HGB 9.5* 10.5*  HCT 27.9* 31.0*  PLT 174 175   BMET  Basename 04/09/12 0519 04/07/12 1455  NA 135 137  K 3.7 3.2*  CL 104 102  CO2 25 26  GLUCOSE 89 88  BUN 5* 8  CREATININE 0.69 0.63  CALCIUM 8.3* 9.6   PT/INR No results found for this basename: LABPROT:2,INR:2 in the last 72 hours  Studies/Results: Ct Abdomen Pelvis W Contrast  04/07/2012  *RADIOLOGY REPORT*  Clinical Data: Vomiting, abdominal pain.  CT ABDOMEN AND PELVIS WITH CONTRAST  Technique:  Multidetector CT imaging of the abdomen and pelvis was performed following the standard protocol during bolus administration of intravenous contrast.  Contrast: OMNIPAQUE IOHEXOL 300 MG/ML  SOLN  Comparison: 12/16/2007  Findings: Lung bases are clear.  No effusions.  Heart is normal size.  There is mild periportal edema within the liver.  This is nonspecific but can be seen with inflammatory processes such as hepatitis.  No focal abnormality in the liver.  Gallbladder, stomach, spleen, pancreas, adrenals and kidneys are unremarkable. No hydronephrosis.  Uterus, adnexa and  urinary bladder grossly unremarkable.  Trace free fluid in the pelvis, likely physiologic. Large and small bowel are grossly unremarkable.  Appendix is visualized. The proximal appendix is decompressed.  Mid to distal appendix is borderline in diameter at 7 to 8 mm, best seen on the coronal images.  No inflammatory changes around the appendix, but cannot exclude early appendicitis.  Mildly prominent right lower quadrant mesenteric lymph nodes.  Aorta is normal caliber.  No acute bony abnormality.  IMPRESSION: Mildly prominent mid to distal appendix.  Cannot exclude early appendicitis.  No current inflammatory change around the appendix. Recommend clinical correlation.  Mild periportal edema within the liver, a nonspecific finding. This can be related to inflammatory processes such as hepatitis. Recommend clinical correlation.   Original Report Authenticated By: Charlett Nose, M.D.     Anti-infectives: Anti-infectives     Start     Dose/Rate Route Frequency Ordered Stop   04/08/12 0000   Ampicillin-Sulbactam (UNASYN) 3 g in sodium chloride 0.9 % 100 mL IVPB        3 g 100 mL/hr over 60 Minutes Intravenous Every 6 hours 04/07/12 2354            Assessment/Plan: Impression: Lower abdominal pain worse today, more concerning for acute appendicitis. Plan: We'll take the patient to the operating room today for laparoscopic appendectomy. The risks and benefits of the procedure including bleeding, infection, and the possibility of an open procedure were fully explained to the patient, who gave informed consent.  LOS: 2 days  Franky Macho A 04/09/2012

## 2012-04-09 NOTE — Anesthesia Preprocedure Evaluation (Signed)
Anesthesia Evaluation  Patient identified by MRN, date of birth, ID band Patient awake    Reviewed: Allergy & Precautions, H&P , NPO status , Patient's Chart, lab work & pertinent test results  History of Anesthesia Complications Negative for: history of anesthetic complications  Airway Mallampati: I TM Distance: >3 FB Neck ROM: Full    Dental No notable dental hx.    Pulmonary neg pulmonary ROS,    Pulmonary exam normal       Cardiovascular negative cardio ROS  Rhythm:Regular Rate:Normal     Neuro/Psych negative neurological ROS  negative psych ROS   GI/Hepatic negative GI ROS, Neg liver ROS,   Endo/Other  negative endocrine ROS  Renal/GU negative Renal ROS     Musculoskeletal negative musculoskeletal ROS (+)   Abdominal Normal abdominal exam  (+)   Peds  Hematology negative hematology ROS (+) Blood dyscrasia, anemia ,   Anesthesia Other Findings   Reproductive/Obstetrics negative OB ROS                           Anesthesia Physical Anesthesia Plan  ASA: II  Anesthesia Plan: General   Post-op Pain Management:    Induction: Intravenous  Airway Management Planned: Oral ETT  Additional Equipment:   Intra-op Plan:   Post-operative Plan: Extubation in OR  Informed Consent: I have reviewed the patients History and Physical, chart, labs and discussed the procedure including the risks, benefits and alternatives for the proposed anesthesia with the patient or authorized representative who has indicated his/her understanding and acceptance.     Plan Discussed with: CRNA  Anesthesia Plan Comments:         Anesthesia Quick Evaluation

## 2012-04-09 NOTE — Anesthesia Procedure Notes (Signed)
Procedure Name: Intubation Date/Time: 04/09/2012 12:54 PM Performed by: Glynn Octave E Pre-anesthesia Checklist: Patient identified, Patient being monitored, Timeout performed, Emergency Drugs available and Suction available Patient Re-evaluated:Patient Re-evaluated prior to inductionOxygen Delivery Method: Circle System Utilized Preoxygenation: Pre-oxygenation with 100% oxygen Intubation Type: IV induction, Rapid sequence and Cricoid Pressure applied Ventilation: Mask ventilation without difficulty Laryngoscope Size: Mac and 3 Grade View: Grade I Tube type: Oral Tube size: 7.0 mm Number of attempts: 1 Airway Equipment and Method: stylet Placement Confirmation: ETT inserted through vocal cords under direct vision,  positive ETCO2 and breath sounds checked- equal and bilateral Secured at: 21 cm Tube secured with: Tape Dental Injury: Teeth and Oropharynx as per pre-operative assessment

## 2012-04-09 NOTE — Transfer of Care (Signed)
Immediate Anesthesia Transfer of Care Note  Patient: Stacy Oconnell  Procedure(s) Performed: Procedure(s) (LRB) with comments: APPENDECTOMY LAPAROSCOPIC (N/A)  Patient Location: PACU  Anesthesia Type:General  Level of Consciousness: awake, alert  and oriented  Airway & Oxygen Therapy: Patient Spontanous Breathing and Patient connected to face mask oxygen  Post-op Assessment: Report given to PACU RN  Post vital signs: Reviewed and stable  Complications: No apparent anesthesia complications

## 2012-04-10 LAB — BASIC METABOLIC PANEL
CO2: 29 mEq/L (ref 19–32)
Calcium: 8.5 mg/dL (ref 8.4–10.5)
Creatinine, Ser: 0.82 mg/dL (ref 0.50–1.10)
GFR calc Af Amer: >90 mL/min (ref >90)
GFR calc non Af Amer: >90 mL/min (ref >90)
Sodium: 134 mEq/L — ABNORMAL LOW (ref 135–145)

## 2012-04-10 LAB — CBC
MCH: 28.8 pg (ref 26.0–34.0)
MCV: 86.2 fL (ref 78.0–100.0)
Platelets: 181 10*3/uL (ref 150–400)
RBC: 3.26 MIL/uL — ABNORMAL LOW (ref 3.87–5.11)
RDW: 13.1 % (ref 11.5–15.5)

## 2012-04-10 MED ORDER — HYDROCODONE-ACETAMINOPHEN 5-325 MG PO TABS: 1.0000 | ORAL_TABLET | ORAL | Status: DC | PRN

## 2012-04-10 NOTE — Discharge Summary (Signed)
Physician Discharge Summary  Patient ID: Stacy Oconnell MRN: [id number] DOB/AGE: February 23, 1991 21 y.o.  Admit date: 04/07/2012 Discharge date: 04/10/2012  Admission Diagnoses: Acute appendicitis  Discharge Diagnoses: Same Active Problems:  * No active hospital problems. *    Discharged Condition: good  Hospital Course: Patient is a 22 year old black female who presented to the emergency room with worsening lower abdominal pain. Surgery was consulted and she was admitted to the hospital for further evaluation treatment. Her symptoms were atypical in nature. Her pain finally seemed to localize to the right lower quadrant. She subsequently underwent a laparoscopic appendectomy on 04/09/2012. She tolerated the procedure well. Her diet was advanced at difficulty. She is being discharged home on 04/10/2012 in good improving condition.  Treatments: surgery: Laparoscopic appendectomy on 04/09/2012  Discharge Exam: Blood pressure 112/79, pulse 58, temperature 98.3 F (36.8 C), temperature source Oral, resp. rate 20, height 5\' 5"  (1.651 m), weight 45.6 kg (100 lb 8.5 oz), last menstrual period 04/05/2012, SpO2 99.00%. General appearance: alert, cooperative and no distress Resp: clear to auscultation bilaterally Cardio: regular rate and rhythm, S1, S2 normal, no murmur, click, rub or gallop GI: Soft, flat. Dressings dry and intact.  Disposition: 01-Home or Self Care     Medication List     As of 04/10/2012  8:33 AM    TAKE these medications         HYDROcodone-acetaminophen 5-325 MG per tablet   Commonly known as: NORCO/VICODIN   Take 1 tablet by mouth every 4 (four) hours as needed for pain.      ibuprofen 200 MG tablet   Commonly known as: ADVIL,MOTRIN   Take 800 mg by mouth every 6 (six) hours as needed. Pain           Follow-up Information    Follow up with Dalia Heading, MD. Schedule an appointment as soon as possible for a visit on 04/15/2012.   Contact information:   [id number] [place] [address] [phone]          Signed: Franky Macho A 04/10/2012, 8:33 AM

## 2012-04-13 ENCOUNTER — Encounter (HOSPITAL_COMMUNITY): Payer: Self-pay | Admitting: General Surgery

## 2012-04-15 NOTE — Progress Notes (Signed)
Pt and her father verbalize understanding of d/c instructions, wound care, follow up appts and prescriptions. All questions answered at this time. IV d/c. Pt d/c via wheelchair by NT. Sheryn Bison

## 2013-06-22 ENCOUNTER — Encounter (HOSPITAL_COMMUNITY): Payer: Self-pay | Admitting: Emergency Medicine

## 2013-06-22 ENCOUNTER — Emergency Department (HOSPITAL_COMMUNITY)
Admission: EM | Admit: 2013-06-22 | Discharge: 2013-06-22 | Disposition: A | Discharge status: Home / Self Care | Payer: Self-pay | Attending: Emergency Medicine | Admitting: Emergency Medicine

## 2013-06-22 DIAGNOSIS — F17200 Nicotine dependence, unspecified, uncomplicated: Secondary | ICD-10-CM | POA: Insufficient documentation

## 2013-06-22 DIAGNOSIS — L253 Unspecified contact dermatitis due to other chemical products: Secondary | ICD-10-CM | POA: Insufficient documentation

## 2013-06-22 MED ORDER — TRIAMCINOLONE ACETONIDE 0.025 % EX CREA: 1.0000 "application " | TOPICAL_CREAM | Freq: Two times a day (BID) | CUTANEOUS | Status: DC

## 2013-06-22 NOTE — Discharge Instructions (Signed)
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
## 2013-06-24 NOTE — ED Provider Notes (Signed)
Medical screening examination/treatment/procedure(s) were performed by non-physician practitioner and as supervising physician I was immediately available for consultation/collaboration.   EKG Interpretation None        Mattheo Swindle L Hiroki Wint, MD 06/24/13 1112 
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
## 2013-07-19 IMAGING — CT CT ABD-PELV W/ CM
2 of 3 series · 15 of 46 positions shown, 17 images · IV contrast (Omnipaque 300)
Comparison: 12/16/2007

CLINICAL DATA: Vomiting, abdominal pain.

CT ABDOMEN AND PELVIS WITH CONTRAST
TECHNIQUE: Multidetector CT imaging of the abdomen and pelvis was
performed following the standard protocol during bolus
administration of intravenous contrast.
Contrast: 100mL OMNIPAQUE IOHEXOL 300 MG/ML  SOLN

[Series 2: abd_pel_with 5.0 b40f · axial · 0.59mm/px · z∈[-420,-80]mm · 12 of 78 slices shown, 14 images]
[im 5/78  soft-tissue]
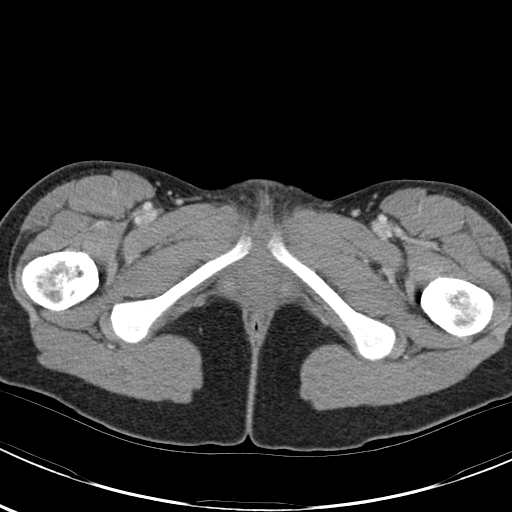
[im 5/78  bone]
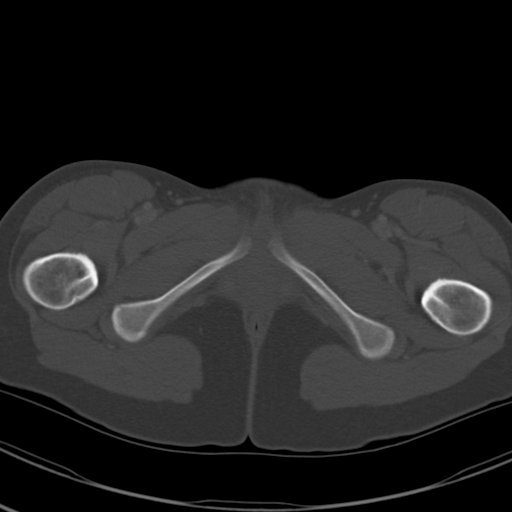
[im 10/78  soft-tissue]
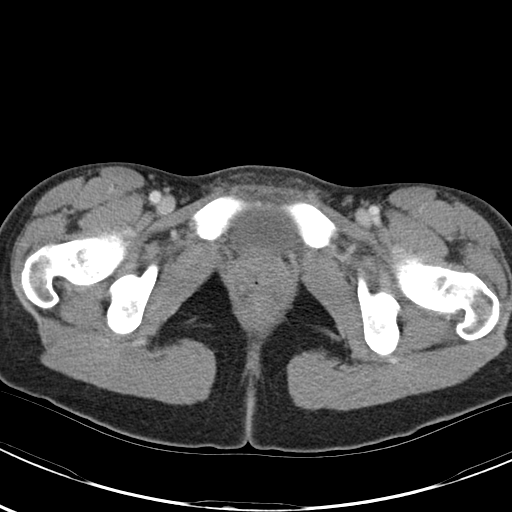
[im 18/78  soft-tissue]
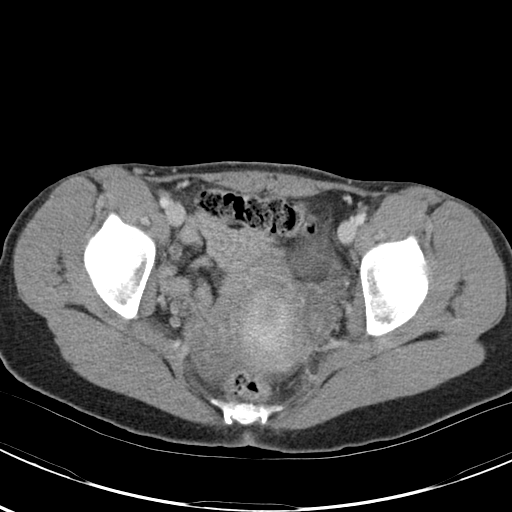
[im 23/78  soft-tissue]
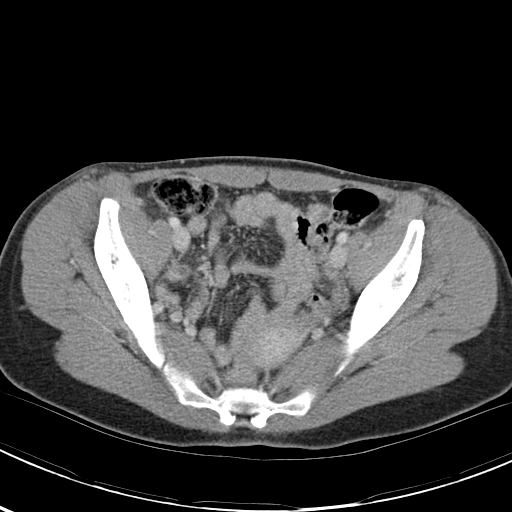
[im 30/78  soft-tissue]
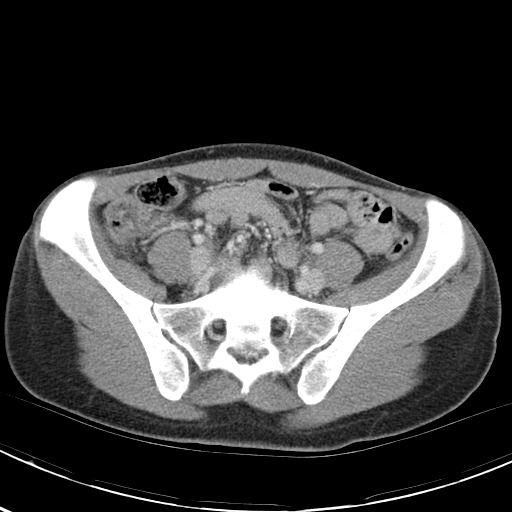
[im 35/78  soft-tissue]
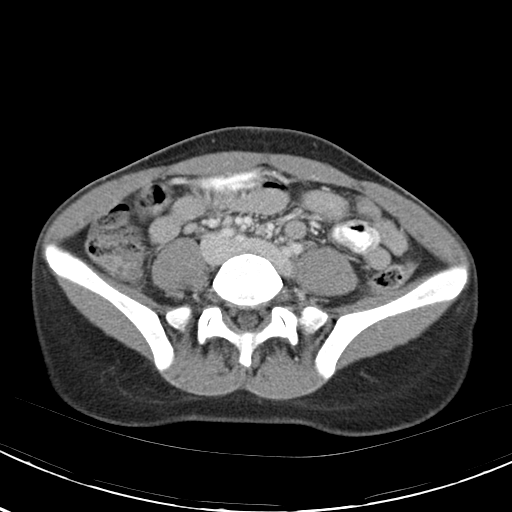
[im 43/78  soft-tissue]
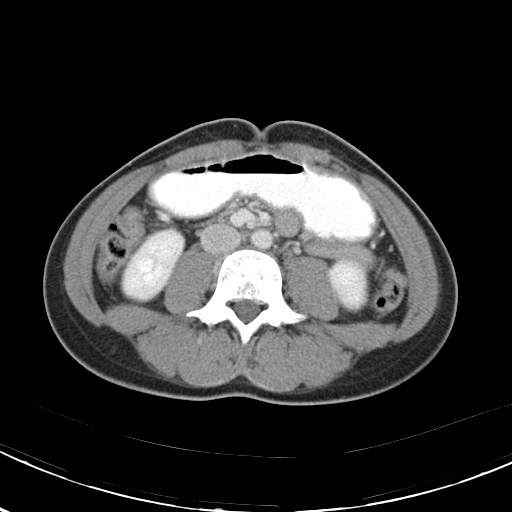
[im 48/78  soft-tissue]
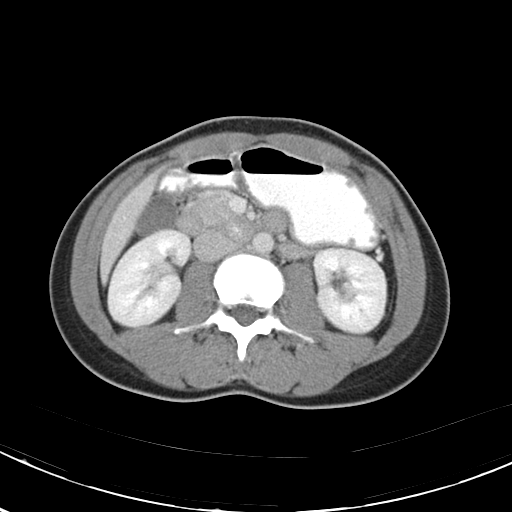
[im 55/78  soft-tissue]
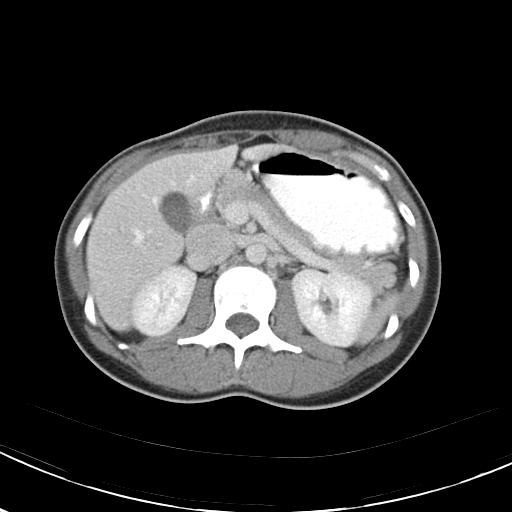
[im 55/78  bone]
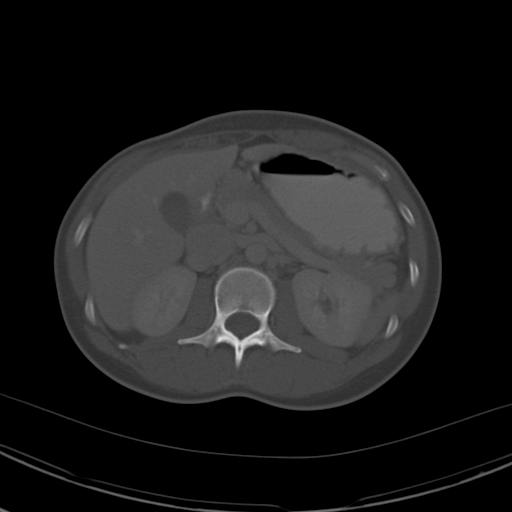
[im 60/78  soft-tissue]
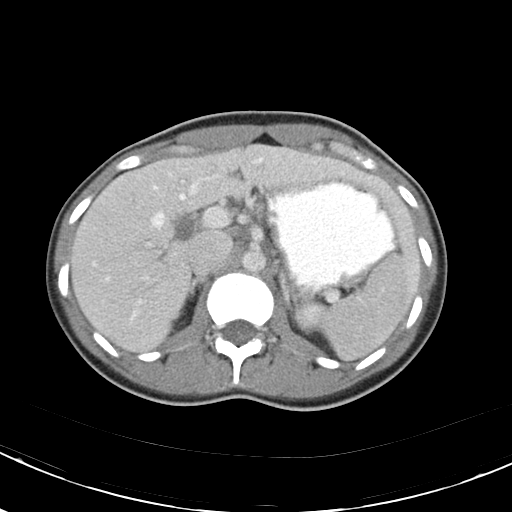
[im 68/78  soft-tissue]
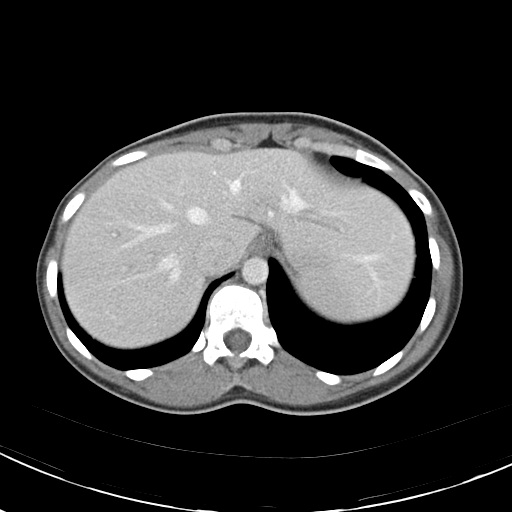
[im 73/78  soft-tissue]
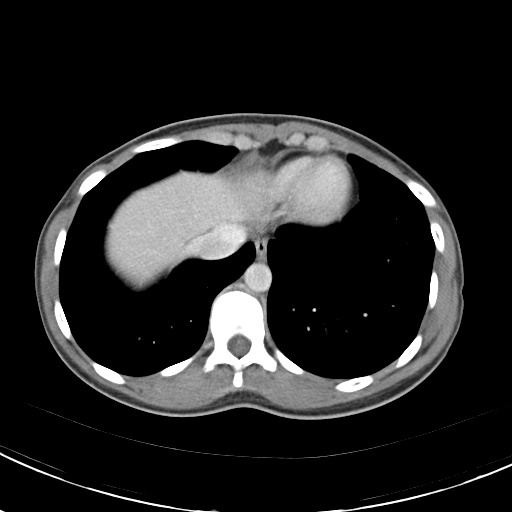

[Series 4: abd_pel_with 3.0 spo · coronal · 0.48mm/px · 3 of 61 slices shown]
[im 21/61  soft-tissue]
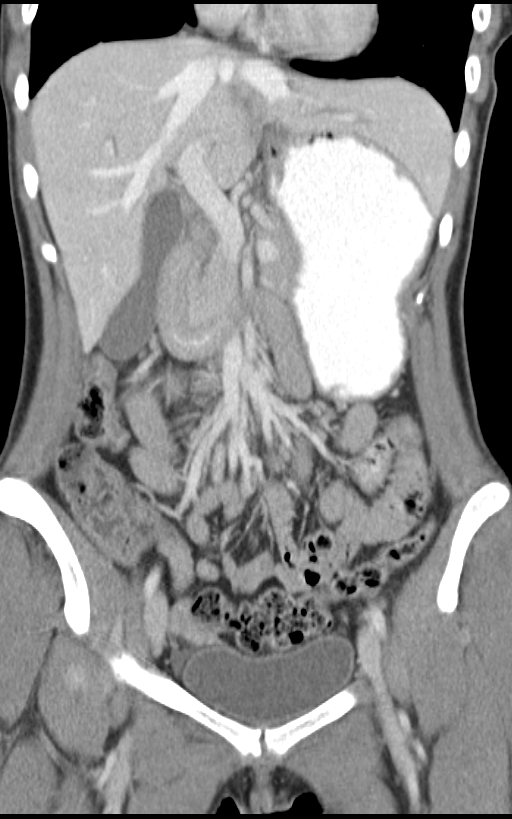
[im 27/61  soft-tissue]
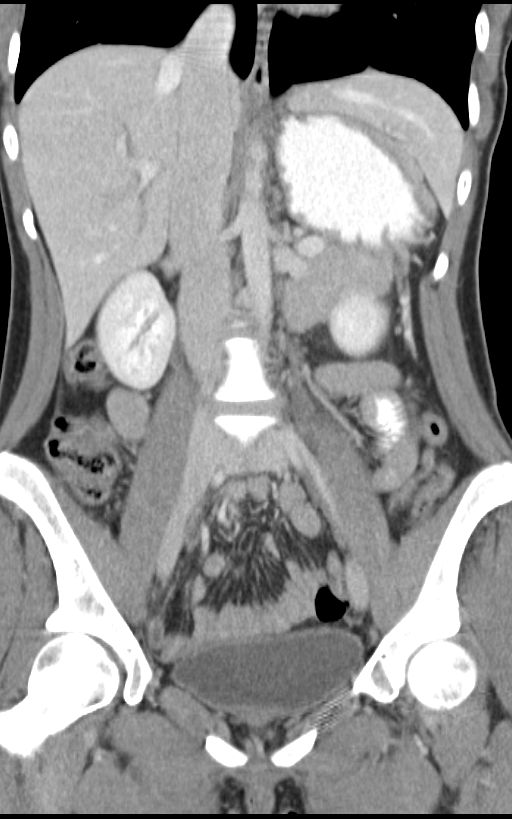
[im 34/61  soft-tissue]
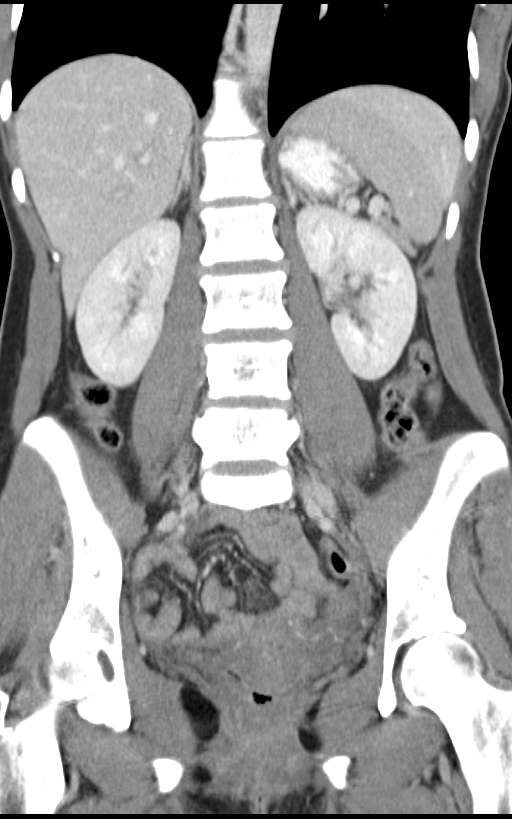

[15 of 46 positions shown; findings below may reference images not displayed]

FINDINGS: Lung bases are clear.  No effusions.  Heart is normal
size.

There is mild periportal edema within the liver.  This is
nonspecific but can be seen with inflammatory processes such as
hepatitis.  No focal abnormality in the liver.  Gallbladder,
stomach, spleen, pancreas, adrenals and kidneys are unremarkable.
No hydronephrosis.  Uterus, adnexa and urinary bladder grossly
unremarkable.  Trace free fluid in the pelvis, likely physiologic.
Large and small bowel are grossly unremarkable.  Appendix is
visualized. The proximal appendix is decompressed.  Mid to distal
appendix is borderline in diameter at 7 to 8 mm, best seen on the
coronal images.  No inflammatory changes around the appendix, but
cannot exclude early appendicitis.  Mildly prominent right lower
quadrant mesenteric lymph nodes.  Aorta is normal caliber.

No acute bony abnormality.
IMPRESSION: Mildly prominent mid to distal appendix.  Cannot exclude early
appendicitis.  No current inflammatory change around the appendix.
Recommend clinical correlation.

Mild periportal edema within the liver, a nonspecific finding.
This can be related to inflammatory processes such as hepatitis.
Recommend clinical correlation.

## 2013-12-12 ENCOUNTER — Emergency Department (HOSPITAL_COMMUNITY)
Admission: EM | Admit: 2013-12-12 | Discharge: 2013-12-12 | Disposition: A | Discharge status: Home / Self Care | Payer: Self-pay | Attending: Emergency Medicine | Admitting: Emergency Medicine

## 2013-12-12 ENCOUNTER — Encounter (HOSPITAL_COMMUNITY): Payer: Self-pay | Admitting: Emergency Medicine

## 2013-12-12 DIAGNOSIS — F17200 Nicotine dependence, unspecified, uncomplicated: Secondary | ICD-10-CM | POA: Insufficient documentation

## 2013-12-12 DIAGNOSIS — J029 Acute pharyngitis, unspecified: Secondary | ICD-10-CM | POA: Insufficient documentation

## 2013-12-12 DIAGNOSIS — Z792 Long term (current) use of antibiotics: Secondary | ICD-10-CM | POA: Insufficient documentation

## 2013-12-12 DIAGNOSIS — J45909 Unspecified asthma, uncomplicated: Secondary | ICD-10-CM | POA: Insufficient documentation

## 2013-12-12 HISTORY — DX: Unspecified asthma, uncomplicated: J45.909

## 2013-12-12 LAB — RAPID STREP SCREEN (MED CTR MEBANE ONLY): Streptococcus, Group A Screen (Direct): NEGATIVE

## 2013-12-12 MED ORDER — AMOXICILLIN 500 MG PO CAPS: 500.0000 mg | ORAL_CAPSULE | Freq: Three times a day (TID) | ORAL | Status: DC

## 2013-12-12 NOTE — Discharge Instructions (Signed)
Follow up with dr. Suszanne Conners if not improving

## 2013-12-12 NOTE — ED Notes (Signed)
Pt presents to the ED c/o of a sore throat. Pt states it has hurt for the past two months, but pain increased in the last two weeks. Pt states "it feels like there is something in my throat." Pt denies any fevers or injury.

## 2013-12-12 NOTE — ED Provider Notes (Signed)
CSN: [id number]     Arrival date & time 12/12/13  1303 History   First MD Initiated Contact with Patient 12/12/13 1454     Chief Complaint  Patient presents with  . Sore Throat     (Consider location/radiation/quality/duration/timing/severity/associated sxs/prior Treatment) Patient is a 23 y.o. female presenting with pharyngitis. The history is provided by the patient (pt complains of a sorethroat).  Sore Throat The current episode started more than 2 days ago. The problem occurs constantly. The problem has not changed since onset.Pertinent negatives include no chest pain, no abdominal pain and no headaches. Nothing aggravates the symptoms. Nothing relieves the symptoms.    Past Medical History  Diagnosis Date  . Asthma    Past Surgical History  Procedure Laterality Date  . Mouth surgery      Teeth extracted  . Laparoscopic appendectomy  04/09/2012    Procedure: APPENDECTOMY LAPAROSCOPIC;  Surgeon: Dalia Heading, MD;  Location: [facility];  Service: General;  Laterality: N/A;   No family history on file. History  Substance Use Topics  . Smoking status: Current Every Day Smoker -- 1.00 packs/day for 2 years    Types: Cigarettes  . Smokeless tobacco: Not on file  . Alcohol Use: Yes     Comment: occ    OB History   Grav Para Term Preterm Abortions TAB SAB Ect Mult Living                 Review of Systems  Constitutional: Negative for appetite change and fatigue.  HENT: Negative for congestion, ear discharge and sinus pressure.   Eyes: Negative for discharge.  Respiratory: Negative for cough.   Cardiovascular: Negative for chest pain.  Gastrointestinal: Negative for abdominal pain and diarrhea.  Genitourinary: Negative for frequency and hematuria.  Musculoskeletal: Negative for back pain.  Skin: Negative for rash.  Neurological: Negative for seizures and headaches.  Psychiatric/Behavioral: Negative for hallucinations.      Allergies  Review of patient's allergies  indicates no known allergies.  Home Medications   Prior to Admission medications   Medication Sig Start Date End Date Taking? Authorizing Provider  amoxicillin (AMOXIL) 500 MG capsule Take 1 capsule (500 mg total) by mouth 3 (three) times daily. 12/12/13   Benny Lennert, MD   BP 128/80  Pulse 65  Temp(Src) 98.2 F (36.8 C) (Oral)  Resp 16  Ht  (1.651 m)  Wt 95 lb (43.092 kg)  BMI 15.81 kg/m2  SpO2 100%  LMP 12/05/2013 Physical Exam  Constitutional: She is oriented to person, place, and time. She appears well-developed.  HENT:  Head: Normocephalic.  pharnx mildly inflamed  Eyes: Conjunctivae and EOM are normal. No scleral icterus.  Neck: Neck supple. No thyromegaly present.  Cardiovascular: Normal rate and regular rhythm.  Exam reveals no gallop and no friction rub.   No murmur heard. Pulmonary/Chest: No stridor. She has no wheezes. She has no rales. She exhibits no tenderness.  Abdominal: She exhibits no distension. There is no tenderness. There is no rebound.  Musculoskeletal: Normal range of motion. She exhibits no edema.  Lymphadenopathy:    She has cervical adenopathy.  Neurological: She is oriented to person, place, and time. She exhibits normal muscle tone. Coordination normal.  Skin: No rash noted. No erythema.  Psychiatric: She has a normal mood and affect. Her behavior is normal.    ED Course  Procedures (including critical care time) Labs Review Labs Reviewed  RAPID STREP SCREEN  CULTURE, GROUP A STREP  Imaging Review No results found.   EKG Interpretation None      MDM   Final diagnoses:  Pharyngitis    tx with amox and follow up     Benny Lennert, MD 12/12/13 [phone]

## 2013-12-14 LAB — CULTURE, GROUP A STREP

## 2015-02-03 ENCOUNTER — Emergency Department
Admission: EM | Admit: 2015-02-03 | Discharge: 2015-02-03 | Disposition: A | Discharge status: Home / Self Care | Payer: Self-pay | Attending: Emergency Medicine | Admitting: Emergency Medicine

## 2015-02-03 ENCOUNTER — Encounter: Payer: Self-pay | Admitting: Emergency Medicine

## 2015-02-03 DIAGNOSIS — Z720 Tobacco use: Secondary | ICD-10-CM | POA: Insufficient documentation

## 2015-02-03 DIAGNOSIS — J45909 Unspecified asthma, uncomplicated: Secondary | ICD-10-CM | POA: Insufficient documentation

## 2015-02-03 DIAGNOSIS — Z792 Long term (current) use of antibiotics: Secondary | ICD-10-CM | POA: Insufficient documentation

## 2015-02-03 DIAGNOSIS — J069 Acute upper respiratory infection, unspecified: Secondary | ICD-10-CM | POA: Insufficient documentation

## 2015-02-03 MED ORDER — FLUTICASONE PROPIONATE 50 MCG/ACT NA SUSP: 1.0000 | Freq: Two times a day (BID) | NASAL | Status: DC

## 2015-02-03 MED ORDER — CETIRIZINE HCL 10 MG PO TABS: 10.0000 mg | ORAL_TABLET | Freq: Every day | ORAL | Status: DC

## 2015-02-03 MED ORDER — MAGIC MOUTHWASH W/LIDOCAINE: 5.0000 mL | Freq: Four times a day (QID) | ORAL | Status: DC

## 2015-02-03 NOTE — ED Provider Notes (Signed)
[facility] Emergency Department Provider Note  ____________________________________________  Time seen: Approximately 1:21 PM  I have reviewed the triage vital signs and the nursing notes.   HISTORY  Chief Complaint Sore Throat    HPI Stacy Oconnell is a 24 y.o. female resents to the emergency department complaining of sore throat, nasal congestion, coughing 3 days. She states that the pain in her throat is scratchy and also described as a "lump." She states that she has no problems swallowing or breathing. She states that symptoms began insidiously. She denies taking any medication for same. She denies any fever.   Past Medical History  Diagnosis Date  . Asthma     There are no active problems to display for this patient.   Past Surgical History  Procedure Laterality Date  . Mouth surgery      Teeth extracted  . Laparoscopic appendectomy  04/09/2012    Procedure: APPENDECTOMY LAPAROSCOPIC;  Surgeon: Dalia HeadingMark A Jenkins, MD;  Location: [facility];  Service: General;  Laterality: N/A;    Current Outpatient Rx  Name  Route  Sig  Dispense  Refill  . amoxicillin (AMOXIL) 500 MG capsule   Oral   Take 1 capsule (500 mg total) by mouth 3 (three) times daily.   21 capsule   0   . cetirizine (ZYRTEC) 10 MG tablet   Oral   Take 1 tablet (10 mg total) by mouth daily.   30 tablet   0   . fluticasone (FLONASE) 50 MCG/ACT nasal spray   Each Nare   Place 1 spray into both nostrils 2 (two) times daily.   16 g   0   . magic mouthwash w/lidocaine SOLN   Oral   Take 5 mLs by mouth 4 (four) times daily.   240 mL   0     Dispense in a 1/1/1/1 ratio     Allergies Review of patient's allergies indicates no known allergies.  No family history on file.  Social History Social History  Substance Use Topics  . Smoking status: Current Every Day Smoker -- 1.00 packs/day for 2 years    Types: Cigarettes  . Smokeless tobacco: None  . Alcohol Use: Yes   Comment: occ     Review of Systems Constitutional: No fever/chills Eyes: No visual changes. ENT: Endorses sore throat. Endorses nasal congestion and drainage. Cardiovascular: Denies chest pain. Respiratory: Denies shortness of breath. Endorses a cough. Gastrointestinal: No abdominal pain.  No nausea, no vomiting.  No diarrhea.  No constipation. Genitourinary: Negative for dysuria. Musculoskeletal: Negative for back pain. Skin: Negative for rash. Neurological: Negative for headaches, focal weakness or numbness.  10-point ROS otherwise negative.  ____________________________________________   PHYSICAL EXAM:  VITAL SIGNS: ED Triage Vitals  Enc Vitals Group     BP --      Pulse --      Resp --      Temp --      Temp src --      SpO2 --      Weight --      Height --      Head Cir --      Peak Flow --      Pain Score --      Pain Loc --      Pain Edu? --      Excl. in GC? --     Constitutional: Alert and oriented. Well appearing and in no acute distress. Eyes: Conjunctivae are normal.  PERRL. EOMI. Head: Atraumatic. Nose: No congestion/rhinnorhea. Mouth/Throat: Mucous membranes are moist.  Oropharynx non-erythematous. Neck: No stridor.   Hematological/Lymphatic/Immunilogical: No anterior cervical lymphadenopathy. Cardiovascular: Normal rate, regular rhythm. Grossly normal heart sounds.  Good peripheral circulation. Respiratory: Normal respiratory effort.  No retractions. Lungs CTAB. Gastrointestinal: Soft and nontender. No distention. No abdominal bruits. No CVA tenderness. Musculoskeletal: No lower extremity tenderness nor edema.  No joint effusions. Neurologic:  Normal speech and language. No gross focal neurologic deficits are appreciated. No gait instability. Skin:  Skin is warm, dry and intact. No rash noted. Psychiatric: Mood and affect are normal. Speech and behavior are normal.  ____________________________________________   LABS (all labs ordered are  listed, but only abnormal results are displayed)  Labs Reviewed  CULTURE, GROUP A STREP ([facility] ONLY)    Negative rapid strep test. ____________________________________________  EKG   ____________________________________________  RADIOLOGY   ____________________________________________   PROCEDURES  Procedure(s) performed: None  Critical Care performed: No  ____________________________________________   INITIAL IMPRESSION / ASSESSMENT AND PLAN / ED COURSE  Pertinent labs & imaging results that were available during my care of the patient were reviewed by me and considered in my medical decision making (see chart for details).  Patient's history, symptoms, physical exam were taken into consideration for diagnosis. The patient most likely has a upper respiratory viral illness. I have informed the patient of same. The patient will be provided with prescriptions for symptomatic relief. The patient is advised to drink plenty of fluids, rest, take prescriptions as prescribed. Patient verbalizes understanding of diagnosis and verbalizes compliance with same. ____________________________________________   FINAL CLINICAL IMPRESSION(S) / ED DIAGNOSES  Final diagnoses:  Viral upper respiratory illness      Racheal Patches, PA-C 02/03/15 1406  Loleta Rose, MD 02/03/15 1416

## 2015-02-03 NOTE — Discharge Instructions (Signed)
Upper Respiratory Infection, Adult Most upper respiratory infections (URIs) are a viral infection of the air passages leading to the lungs. A URI affects the nose, throat, and upper air passages. The most common type of URI is nasopharyngitis and is typically referred to as "the common cold." URIs run their course and usually go away on their own. Most of the time, a URI does not require medical attention, but sometimes a bacterial infection in the upper airways can follow a viral infection. This is called a secondary infection. Sinus and middle ear infections are common types of secondary upper respiratory infections. Bacterial pneumonia can also complicate a URI. A URI can worsen asthma and chronic obstructive pulmonary disease (COPD). Sometimes, these complications can require emergency medical care and may be life threatening.  CAUSES Almost all URIs are caused by viruses. A virus is a type of germ and can spread from one person to another.  RISKS FACTORS You may be at risk for a URI if:   You smoke.   You have chronic heart or lung disease.  You have a weakened defense (immune) system.   You are very young or very old.   You have nasal allergies or asthma.  You work in crowded or poorly ventilated areas.  You work in health care facilities or schools. SIGNS AND SYMPTOMS  Symptoms typically develop 2-3 days after you come in contact with a cold virus. Most viral URIs last 7-10 days. However, viral URIs from the influenza virus (flu virus) can last 14-18 days and are typically more severe. Symptoms may include:   Runny or stuffy (congested) nose.   Sneezing.   Cough.   Sore throat.   Headache.   Fatigue.   Fever.   Loss of appetite.   Pain in your forehead, behind your eyes, and over your cheekbones (sinus pain).  Muscle aches.  DIAGNOSIS  Your health care provider may diagnose a URI by:  Physical exam.  Tests to check that your symptoms are not due to  another condition such as:  Strep throat.  Sinusitis.  Pneumonia.  Asthma. TREATMENT  A URI goes away on its own with time. It cannot be cured with medicines, but medicines may be prescribed or recommended to relieve symptoms. Medicines may help:  Reduce your fever.  Reduce your cough.  Relieve nasal congestion. HOME CARE INSTRUCTIONS   Take medicines only as directed by your health care provider.   Gargle warm saltwater or take cough drops to comfort your throat as directed by your health care provider.  Use a warm mist humidifier or inhale steam from a shower to increase air moisture. This may make it easier to breathe.  Drink enough fluid to keep your urine clear or pale yellow.   Eat soups and other clear broths and maintain good nutrition.   Rest as needed.   Return to work when your temperature has returned to normal or as your health care provider advises. You may need to stay home longer to avoid infecting others. You can also use a face mask and careful hand washing to prevent spread of the virus.  Increase the usage of your inhaler if you have asthma.   Do not use any tobacco products, including cigarettes, chewing tobacco, or electronic cigarettes. If you need help quitting, ask your health care provider. PREVENTION  The best way to protect yourself from getting a cold is to practice good hygiene.   Avoid oral or hand contact with people with cold   symptoms.   Wash your hands often if contact occurs.  There is no clear evidence that vitamin C, vitamin E, echinacea, or exercise reduces the chance of developing a cold. However, it is always recommended to get plenty of rest, exercise, and practice good nutrition.  SEEK MEDICAL CARE IF:   You are getting worse rather than better.   Your symptoms are not controlled by medicine.   You have chills.  You have worsening shortness of breath.  You have brown or red mucus.  You have yellow or brown nasal  discharge.  You have pain in your face, especially when you bend forward.  You have a fever.  You have swollen neck glands.  You have pain while swallowing.  You have white areas in the back of your throat. SEEK IMMEDIATE MEDICAL CARE IF:   You have severe or persistent:  Headache.  Ear pain.  Sinus pain.  Chest pain.  You have chronic lung disease and any of the following:  Wheezing.  Prolonged cough.  Coughing up blood.  A change in your usual mucus.  You have a stiff neck.  You have changes in your:  Vision.  Hearing.  Thinking.  Mood. MAKE SURE YOU:   Understand these instructions.  Will watch your condition.  Will get help right away if you are not doing well or get worse.   This information is not intended to replace advice given to you by your health care provider. Make sure you discuss any questions you have with your health care provider.   Document Released: 09/17/2000 Document Revised: 08/08/2014 Document Reviewed: 06/29/2013 Elsevier Interactive Patient Education 2016 [employer]  

## 2015-02-03 NOTE — ED Notes (Signed)
Pt presents to the ER from home with complaints of sore throat for 3 days. Pt reprots nasal congestion, coughing, denies any fever. Pt talks in complete sentences no distress noted.

## 2015-02-03 NOTE — ED Notes (Signed)
Pt verbalizes understanding of discharge instructions.

## 2015-02-04 LAB — POCT RAPID STREP A: Streptococcus, Group A Screen (Direct): NEGATIVE

## 2015-02-06 LAB — CULTURE, GROUP A STREP (THRC)

## 2015-02-07 NOTE — Progress Notes (Signed)
Patient d/c from ED 02/03/15 without prescription with d/c diagnosis of viral uri. Throat culture came back with group C strep. Recommended no treatment to ED physician, Dr. Lenard LancePaduchowski, who concurred.

## 2015-10-01 ENCOUNTER — Emergency Department (HOSPITAL_COMMUNITY)
Admission: EM | Admit: 2015-10-01 | Discharge: 2015-10-01 | Disposition: A | Discharge status: Home / Self Care | Payer: Self-pay | Attending: Emergency Medicine | Admitting: Emergency Medicine

## 2015-10-01 ENCOUNTER — Encounter (HOSPITAL_COMMUNITY): Payer: Self-pay | Admitting: Emergency Medicine

## 2015-10-01 DIAGNOSIS — J45909 Unspecified asthma, uncomplicated: Secondary | ICD-10-CM | POA: Insufficient documentation

## 2015-10-01 DIAGNOSIS — Z79899 Other long term (current) drug therapy: Secondary | ICD-10-CM | POA: Insufficient documentation

## 2015-10-01 DIAGNOSIS — Z792 Long term (current) use of antibiotics: Secondary | ICD-10-CM | POA: Insufficient documentation

## 2015-10-01 DIAGNOSIS — F1290 Cannabis use, unspecified, uncomplicated: Secondary | ICD-10-CM | POA: Insufficient documentation

## 2015-10-01 DIAGNOSIS — J029 Acute pharyngitis, unspecified: Secondary | ICD-10-CM | POA: Insufficient documentation

## 2015-10-01 DIAGNOSIS — F17210 Nicotine dependence, cigarettes, uncomplicated: Secondary | ICD-10-CM | POA: Insufficient documentation

## 2015-10-01 LAB — RAPID STREP SCREEN (MED CTR MEBANE ONLY): STREPTOCOCCUS, GROUP A SCREEN (DIRECT): NEGATIVE

## 2015-10-01 NOTE — Discharge Instructions (Signed)

## 2015-10-01 NOTE — ED Notes (Signed)
Patient complaining of sore throat x 2 days. 

## 2015-10-01 NOTE — ED Provider Notes (Signed)
CSN: [id number]     Arrival date & time 10/01/15  1140 History  By signing my name below, I, [facility]osp[facility]arrissa Oconnell, attest that this documentation has been prepared under the direction and in the presence of Roxy Horsemanobert Stoy Fenn, PA-C. Electronically Signed: Randell PatientMarrissa Oconnell, ED Scribe. 10/01/2015. 1:44 PM.   Chief Complaint  Patient presents with  . Sore Throat    The history is provided by the patient. No language interpreter was used.   HPI Comments: Stacy Oconnell is a 25 y.o. female with a hx of asthma who presents to the Emergency Department complaining of constant, gradually worsening sore throat onset 2 days ago. Denies recent sick contact. Denies fevers.  Past Medical History  Diagnosis Date  . Asthma    Past Surgical History  Procedure Laterality Date  . Mouth surgery      Teeth extracted  . Laparoscopic appendectomy  04/09/2012    Procedure: APPENDECTOMY LAPAROSCOPIC;  Surgeon: Dalia HeadingMark A Jenkins, MD;  Location: [facility];  Service: General;  Laterality: N/A;  . Appendectomy     History reviewed. No pertinent family history. Social History  Substance Use Topics  . Smoking status: Current Every Day Smoker -- 1.00 packs/day for 2 years    Types: Cigarettes  . Smokeless tobacco: None  . Alcohol Use: Yes     Comment: occ    OB History    No data available     Review of Systems  Constitutional: Negative for fever.  HENT: Positive for sore throat.       Allergies  Review of patient's allergies indicates no known allergies.  Home Medications   Prior to Admission medications   Medication Sig Start Date End Date Taking? Authorizing Provider  amoxicillin (AMOXIL) 500 MG capsule Take 1 capsule (500 mg total) by mouth 3 (three) times daily. 12/12/13   Bethann BerkshireJoseph Zammit, MD  cetirizine (ZYRTEC) 10 MG tablet Take 1 tablet (10 mg total) by mouth daily. 02/03/15   Delorise RoyalsJonathan D Cuthriell, PA-C  fluticasone (FLONASE) 50 MCG/ACT nasal spray Place 1 spray into both nostrils 2 (two) times  daily. 02/03/15   Delorise RoyalsJonathan D Cuthriell, PA-C  magic mouthwash w/lidocaine SOLN Take 5 mLs by mouth 4 (four) times daily. 02/03/15   Delorise RoyalsJonathan D Cuthriell, PA-C   BP 114/77 mmHg  Pulse 77  Temp(Src) 98.7 F (37.1 C) (Oral)  Ht 5\' 3"  (1.6 m)  SpO2 99%  LMP 08/31/2015 Physical Exam  Constitutional: She is oriented to person, place, and time. She appears well-developed and well-nourished. No distress.  HENT:  Head: Normocephalic and atraumatic.  Mouth/Throat: Uvula is midline. Posterior oropharyngeal erythema present. No oropharyngeal exudate or tonsillar abscesses.  Oropharynx moderately erythematous. No exudate or abscess. Uvula midline. Airway intact.  Eyes: Conjunctivae are normal.  Neck: Normal range of motion.  No stridor. Positive posterior cervical adenopathy.  Cardiovascular: Normal rate.   Pulmonary/Chest: Effort normal. No stridor. No respiratory distress.  Musculoskeletal: Normal range of motion.  Lymphadenopathy:    She has cervical adenopathy.  Neurological: She is alert and oriented to person, place, and time.  Skin: Skin is warm and dry.  Psychiatric: She has a normal mood and affect. Her behavior is normal.  Nursing note and vitals reviewed.   ED Course  Procedures   DIAGNOSTIC STUDIES: Oxygen Saturation is 99% on RA, normal by my interpretation.    COORDINATION OF CARE: 12:30 PM Will order strep test. Discussed treatment plan with pt at bedside and pt agreed to plan.   Labs Review  Labs Reviewed  RAPID STREP SCREEN (NOT [facility])  CULTURE, GROUP A STREP [facility])    I have personally reviewed and evaluated these lab results as part of my medical decision-making.   MDM   Final diagnoses:  Pharyngitis    Pt afebrile without tonsillar exudate, negative strep. Presents with mild cervical lymphadenopathy, & dysphagia; diagnosis of viral pharyngitis. No abx indicated. DC w symptomatic tx for pain  Pt does not appear dehydrated, but did discuss importance of  water rehydration. Presentation non concerning for PTA or infxn spread to soft tissue. No trismus or uvula deviation. Specific return precautions discussed. Pt able to drink water in ED without difficulty with intact air way. Recommended PCP follow up.   I personally performed the services described in this documentation, which was scribed in my presence. The recorded information has been reviewed and is accurate.      Roxy HorsemanRobert Dylon Correa, PA-C 10/01/15 1349  Marily MemosJason Mesner, MD 10/02/15 [phone]

## 2015-10-04 LAB — CULTURE, GROUP A STREP (THRC)

## 2019-09-20 ENCOUNTER — Other Ambulatory Visit: Payer: Self-pay

## 2019-09-20 ENCOUNTER — Encounter (HOSPITAL_COMMUNITY): Payer: Self-pay | Admitting: Emergency Medicine

## 2019-09-20 ENCOUNTER — Emergency Department (HOSPITAL_COMMUNITY): Payer: BC Managed Care – PPO

## 2019-09-20 ENCOUNTER — Inpatient Hospital Stay (HOSPITAL_COMMUNITY)
Admission: AD | Admit: 2019-09-20 | Discharge: 2019-09-21 | DRG: 441 | Payer: BC Managed Care – PPO | Attending: Internal Medicine | Admitting: Internal Medicine

## 2019-09-20 DIAGNOSIS — R112 Nausea with vomiting, unspecified: Secondary | ICD-10-CM | POA: Diagnosis present

## 2019-09-20 DIAGNOSIS — R809 Proteinuria, unspecified: Secondary | ICD-10-CM | POA: Diagnosis present

## 2019-09-20 DIAGNOSIS — K7589 Other specified inflammatory liver diseases: Secondary | ICD-10-CM | POA: Diagnosis not present

## 2019-09-20 DIAGNOSIS — Z8249 Family history of ischemic heart disease and other diseases of the circulatory system: Secondary | ICD-10-CM

## 2019-09-20 DIAGNOSIS — Z20822 Contact with and (suspected) exposure to covid-19: Secondary | ICD-10-CM | POA: Diagnosis present

## 2019-09-20 DIAGNOSIS — R1010 Upper abdominal pain, unspecified: Secondary | ICD-10-CM

## 2019-09-20 DIAGNOSIS — R824 Acetonuria: Secondary | ICD-10-CM | POA: Diagnosis present

## 2019-09-20 DIAGNOSIS — R1114 Bilious vomiting: Secondary | ICD-10-CM | POA: Diagnosis present

## 2019-09-20 DIAGNOSIS — F17210 Nicotine dependence, cigarettes, uncomplicated: Secondary | ICD-10-CM | POA: Diagnosis present

## 2019-09-20 DIAGNOSIS — B179 Acute viral hepatitis, unspecified: Principal | ICD-10-CM | POA: Diagnosis present

## 2019-09-20 DIAGNOSIS — Z833 Family history of diabetes mellitus: Secondary | ICD-10-CM

## 2019-09-20 DIAGNOSIS — Z841 Family history of disorders of kidney and ureter: Secondary | ICD-10-CM

## 2019-09-20 DIAGNOSIS — K7200 Acute and subacute hepatic failure without coma: Secondary | ICD-10-CM | POA: Diagnosis present

## 2019-09-20 DIAGNOSIS — J45909 Unspecified asthma, uncomplicated: Secondary | ICD-10-CM | POA: Diagnosis present

## 2019-09-20 DIAGNOSIS — D689 Coagulation defect, unspecified: Secondary | ICD-10-CM | POA: Diagnosis present

## 2019-09-20 DIAGNOSIS — E162 Hypoglycemia, unspecified: Secondary | ICD-10-CM | POA: Diagnosis present

## 2019-09-20 DIAGNOSIS — K047 Periapical abscess without sinus: Secondary | ICD-10-CM | POA: Diagnosis present

## 2019-09-20 DIAGNOSIS — R823 Hemoglobinuria: Secondary | ICD-10-CM | POA: Diagnosis present

## 2019-09-20 LAB — CBC
HCT: 42.5 % (ref 36.0–46.0)
Hemoglobin: 14.2 g/dL (ref 12.0–15.0)
MCH: 29.5 pg (ref 26.0–34.0)
MCHC: 33.4 g/dL (ref 30.0–36.0)
MCV: 88.4 fL (ref 80.0–100.0)
Platelets: 258 10*3/uL (ref 150–400)
RBC: 4.81 MIL/uL (ref 3.87–5.11)
RDW: 12.5 % (ref 11.5–15.5)
WBC: 6.6 10*3/uL (ref 4.0–10.5)
nRBC: 0 % (ref 0.0–0.2)

## 2019-09-20 LAB — COMPREHENSIVE METABOLIC PANEL
ALT: 2395 U/L — ABNORMAL HIGH (ref 0–44)
AST: 3570 U/L — ABNORMAL HIGH (ref 15–41)
Albumin: 4.3 g/dL (ref 3.5–5.0)
Alkaline Phosphatase: 101 U/L (ref 38–126)
Anion gap: 17 — ABNORMAL HIGH (ref 5–15)
BUN: 11 mg/dL (ref 6–20)
CO2: 23 mmol/L (ref 22–32)
Calcium: 9.3 mg/dL (ref 8.9–10.3)
Chloride: 94 mmol/L — ABNORMAL LOW (ref 98–111)
Creatinine, Ser: 0.72 mg/dL (ref 0.44–1.00)
GFR calc Af Amer: >60 mL/min (ref >60)
GFR calc non Af Amer: >60 mL/min (ref >60)
Glucose, Bld: 61 mg/dL — ABNORMAL LOW (ref 70–99)
Potassium: 4.1 mmol/L (ref 3.5–5.1)
Sodium: 134 mmol/L — ABNORMAL LOW (ref 135–145)
Total Bilirubin: 4.5 mg/dL — ABNORMAL HIGH (ref 0.3–1.2)
Total Protein: 8.2 g/dL — ABNORMAL HIGH (ref 6.5–8.1)

## 2019-09-20 LAB — URINALYSIS, ROUTINE W REFLEX MICROSCOPIC
Bacteria, UA: NONE SEEN
Bilirubin Urine: NEGATIVE
Glucose, UA: NEGATIVE mg/dL
Ketones, ur: 5 mg/dL — AB
Leukocytes,Ua: NEGATIVE
Nitrite: NEGATIVE
Protein, ur: 100 mg/dL — AB
Specific Gravity, Urine: 1.016 (ref 1.005–1.030)
pH: 5 (ref 5.0–8.0)

## 2019-09-20 LAB — DIFFERENTIAL
Abs Immature Granulocytes: 0.02 10*3/uL (ref 0.00–0.07)
Basophils Absolute: 0 10*3/uL (ref 0.0–0.1)
Basophils Relative: 0 %
Eosinophils Absolute: 0 10*3/uL (ref 0.0–0.5)
Eosinophils Relative: 0 %
Immature Granulocytes: 0 %
Lymphocytes Relative: 5 %
Lymphs Abs: 0.3 10*3/uL — ABNORMAL LOW (ref 0.7–4.0)
Monocytes Absolute: 0.3 10*3/uL (ref 0.1–1.0)
Monocytes Relative: 5 %
Neutro Abs: 6 10*3/uL (ref 1.7–7.7)
Neutrophils Relative %: 90 %

## 2019-09-20 LAB — BILIRUBIN, DIRECT: Bilirubin, Direct: 2 mg/dL — ABNORMAL HIGH (ref 0.0–0.2)

## 2019-09-20 LAB — LIPASE, BLOOD: Lipase: 19 U/L (ref 11–51)

## 2019-09-20 LAB — ACETAMINOPHEN LEVEL: Acetaminophen (Tylenol), Serum: 11 ug/mL (ref 10–30)

## 2019-09-20 LAB — SARS CORONAVIRUS 2 BY RT PCR (HOSPITAL ORDER, PERFORMED IN ~~LOC~~ HOSPITAL LAB): SARS Coronavirus 2: NEGATIVE

## 2019-09-20 MED ORDER — DEXTROSE 50 % IV SOLN
1.0000 | Freq: Once | INTRAVENOUS | Status: AC
  Administered 2019-09-20: 50 mL via INTRAVENOUS
  Filled 2019-09-20: qty 50

## 2019-09-20 MED ORDER — SODIUM CHLORIDE 0.9 % IV BOLUS
1000.0000 mL | Freq: Once | INTRAVENOUS | Status: AC
  Administered 2019-09-20: 1000 mL via INTRAVENOUS

## 2019-09-20 MED ORDER — IOHEXOL 300 MG/ML  SOLN
75.0000 mL | Freq: Once | INTRAMUSCULAR | Status: AC | PRN
  Administered 2019-09-20: 75 mL via INTRAVENOUS

## 2019-09-20 MED ORDER — KCL IN DEXTROSE-NACL 20-5-0.9 MEQ/L-%-% IV SOLN
INTRAVENOUS | Status: DC
  Filled 2019-09-20 (×3): qty 1000

## 2019-09-20 MED ORDER — ONDANSETRON HCL 4 MG/2ML IJ SOLN
4.0000 mg | Freq: Once | INTRAMUSCULAR | Status: AC
  Administered 2019-09-20: 4 mg via INTRAVENOUS
  Filled 2019-09-20: qty 2

## 2019-09-20 MED ORDER — ONDANSETRON HCL 4 MG/2ML IJ SOLN
4.0000 mg | Freq: Four times a day (QID) | INTRAMUSCULAR | Status: DC | PRN
  Administered 2019-09-21 (×2): 4 mg via INTRAVENOUS
  Filled 2019-09-20 (×2): qty 2

## 2019-09-20 MED ORDER — MORPHINE SULFATE (PF) 4 MG/ML IV SOLN
4.0000 mg | Freq: Once | INTRAVENOUS | Status: AC
  Administered 2019-09-20: 4 mg via INTRAVENOUS
  Filled 2019-09-20: qty 1

## 2019-09-20 MED ORDER — MORPHINE SULFATE (PF) 4 MG/ML IV SOLN
4.0000 mg | INTRAVENOUS | Status: DC | PRN
  Administered 2019-09-21 (×3): 4 mg via INTRAVENOUS
  Filled 2019-09-20 (×3): qty 1

## 2019-09-20 MED ORDER — ONDANSETRON HCL 4 MG PO TABS
4.0000 mg | ORAL_TABLET | Freq: Four times a day (QID) | ORAL | Status: DC | PRN
  Filled 2019-09-20: qty 1

## 2019-09-20 MED ORDER — FAMOTIDINE IN NACL 20-0.9 MG/50ML-% IV SOLN
20.0000 mg | Freq: Once | INTRAVENOUS | Status: AC
  Administered 2019-09-20: 20 mg via INTRAVENOUS
  Filled 2019-09-20: qty 50

## 2019-09-20 MED ORDER — ONDANSETRON 4 MG PO TBDP
4.0000 mg | ORAL_TABLET | Freq: Once | ORAL | Status: AC | PRN
  Administered 2019-09-20: 4 mg via ORAL
  Filled 2019-09-20: qty 1

## 2019-09-20 NOTE — ED Provider Notes (Signed)
Received patient as a handoff at shift change from Burgess Amor, PA-C.  In short, patient is here for a 3-day history of epigastric and right upper quadrant abdominal pain with associated nausea and intractable vomiting.  See note from handoff provider for full HPI.  Physical Exam  BP 118/89 (BP Location: Left Arm)   Pulse 75   Temp 98.1 F (36.7 C)   Resp 17   Ht 5\' 5"  (1.651 m)   Wt 49.9 kg   LMP 08/29/2019 (Approximate)   SpO2 100%   BMI 18.30 kg/m   Physical Exam Vitals and nursing note reviewed. Exam conducted with a chaperone present.  Constitutional:      General: She is not in acute distress.    Comments: Uncomfortable.  HENT:     Head: Normocephalic and atraumatic.  Eyes:     General: No scleral icterus.    Conjunctiva/sclera: Conjunctivae normal.  Cardiovascular:     Rate and Rhythm: Normal rate and regular rhythm.     Pulses: Normal pulses.     Heart sounds: Normal heart sounds.  Pulmonary:     Effort: Pulmonary effort is normal. No respiratory distress.     Breath sounds: Normal breath sounds.  Abdominal:     Comments: Soft, nondistended.  TTP in RUQ and epigastrium.  No TTP elsewhere.  No overlying skin changes.  Positive Murphy sign.  Musculoskeletal:     Cervical back: Normal range of motion.  Skin:    General: Skin is dry.     Capillary Refill: Capillary refill takes less than 2 seconds.  Neurological:     Mental Status: She is alert and oriented to person, place, and time.     GCS: GCS eye subscore is 4. GCS verbal subscore is 5. GCS motor subscore is 6.  Psychiatric:        Mood and Affect: Mood normal.        Behavior: Behavior normal.        Thought Content: Thought content normal.     ED Course/Procedures     Procedures Results for orders placed or performed during the hospital encounter of 09/20/19  Lipase, blood  Result Value Ref Range   Lipase 19 11 - 51 U/L  Comprehensive metabolic panel  Result Value Ref Range   Sodium 134 (L) 135 -  145 mmol/L   Potassium 4.1 3.5 - 5.1 mmol/L   Chloride 94 (L) 98 - 111 mmol/L   CO2 23 22 - 32 mmol/L   Glucose, Bld 61 (L) 70 - 99 mg/dL   BUN 11 6 - 20 mg/dL   Creatinine, Ser 09/22/19 0.44 - 1.00 mg/dL   Calcium 9.3 8.9 - 6.62 mg/dL   Total Protein 8.2 (H) 6.5 - 8.1 g/dL   Albumin 4.3 3.5 - 5.0 g/dL   AST 94.7 (H) 15 - 41 U/L   ALT 2,395 (H) 0 - 44 U/L   Alkaline Phosphatase 101 38 - 126 U/L   Total Bilirubin 4.5 (H) 0.3 - 1.2 mg/dL   GFR calc non Af Amer >60 >60 mL/min   GFR calc Af Amer >60 >60 mL/min   Anion gap 17 (H) 5 - 15  CBC  Result Value Ref Range   WBC 6.6 4.0 - 10.5 K/uL   RBC 4.81 3.87 - 5.11 MIL/uL   Hemoglobin 14.2 12.0 - 15.0 g/dL   HCT 6,546 36 - 46 %   MCV 88.4 80.0 - 100.0 fL   MCH 29.5 26.0 - 34.0  pg   MCHC 33.4 30.0 - 36.0 g/dL   RDW 49.7 02.6 - 37.8 %   Platelets 258 150 - 400 K/uL   nRBC 0.0 0.0 - 0.2 %  Urinalysis, Routine w reflex microscopic  Result Value Ref Range   Color, Urine AMBER (A) YELLOW   APPearance HAZY (A) CLEAR   Specific Gravity, Urine 1.016 1.005 - 1.030   pH 5.0 5.0 - 8.0   Glucose, UA NEGATIVE NEGATIVE mg/dL   Hgb urine dipstick SMALL (A) NEGATIVE   Bilirubin Urine NEGATIVE NEGATIVE   Ketones, ur 5 (A) NEGATIVE mg/dL   Protein, ur 588 (A) NEGATIVE mg/dL   Nitrite NEGATIVE NEGATIVE   Leukocytes,Ua NEGATIVE NEGATIVE   RBC / HPF 0-5 0 - 5 RBC/hpf   WBC, UA 0-5 0 - 5 WBC/hpf   Bacteria, UA NONE SEEN NONE SEEN   Squamous Epithelial / LPF 0-5 0 - 5  Differential  Result Value Ref Range   Neutrophils Relative % 90 %   Neutro Abs 6.0 1.7 - 7.7 K/uL   Lymphocytes Relative 5 %   Lymphs Abs 0.3 (L) 0.7 - 4.0 K/uL   Monocytes Relative 5 %   Monocytes Absolute 0.3 0 - 1 K/uL   Eosinophils Relative 0 %   Eosinophils Absolute 0.0 0 - 0 K/uL   Basophils Relative 0 %   Basophils Absolute 0.0 0 - 0 K/uL   Immature Granulocytes 0 %   Abs Immature Granulocytes 0.02 0.00 - 0.07 K/uL  Acetaminophen level  Result Value Ref Range    Acetaminophen (Tylenol), Serum 11 10 - 30 ug/mL   CT ABDOMEN PELVIS W CONTRAST  Result Date: 09/20/2019 CLINICAL DATA:  Nausea and vomiting with abnormal liver function tests. Central abdominal pain. EXAM: CT ABDOMEN AND PELVIS WITH CONTRAST TECHNIQUE: Multidetector CT imaging of the abdomen and pelvis was performed using the standard protocol following bolus administration of intravenous contrast. CONTRAST:  56mL OMNIPAQUE IOHEXOL 300 MG/ML  SOLN COMPARISON:  04/07/2012 FINDINGS: Lower chest: Normal Hepatobiliary: Mild diffuse fatty change of the liver. Mild pericholecystic edema. No focal liver lesion. No calcified gallstones. Pancreas: Normal Spleen: Normal Adrenals/Urinary Tract: Adrenal glands are normal. Kidneys are normal. Bladder is normal. Stomach/Bowel: No abnormal bowel finding. No ileus or obstruction. No inflammatory pathology evident. Previous appendectomy. Vascular/Lymphatic: Normal Reproductive: Retroverted uterus. Bilateral adrenal cysts, likely functional in a person of this age. No visible pelvic inflammatory change. Other: No free fluid or air. Musculoskeletal: Normal IMPRESSION: Mild fatty change of the liver. Mild pericholecystic edema without evidence of calcified gallstones. Consider right upper quadrant ultrasound if there is concern about potential cholecystitis. CT finding is not highly suspicious. No other abnormal finding. Electronically Signed   By: Paulina Fusi M.D.   On: 09/20/2019 20:29    MDM   Patient is found to have a significant transaminitis concerning for hepatitis.  Last labs obtained were 7 years ago, uncertain as to chronicity.  Plan per handoff provider's admission for ongoing evaluation and management.  CT of abdomen and pelvis was pending at time of handoff.  Patient states that her pain had been constant since onset of symptoms.  She states that it was mildly alleviated with morphine here in the ED, but it has since returned.  She denies any marijuana or alcohol  use.  Denies Hx IVDA.  Hx of tattoos.    9:42 PM Spoke with Dr. Lovell Sheehan, general surgery, who has lower suspicion for cholecystitis despite positive Murphy sign given her significant transaminitis.  Instead, he is recommending that patient be admitted to the hospital with GI consult.  He will evaluate patient in the morning.  10:33 PM Spoke with Dr. Olevia Bowens who will see and admit patient for her transaminitis of undifferentiated etiology.       Corena Herter, PA-C 09/20/19 2234    Milton Ferguson, MD 09/20/19 2235

## 2019-09-20 NOTE — H&P (Signed)
History and Physical    Stacy Oconnell ZWC:[id number] DOB: 20-Mar-1991 DOA: 09/20/2019  PCP: Patient, No Pcp Per   Patient coming from: Home.  I have personally briefly reviewed patient's old medical records in [place]  Chief Complaint: RUQ pain and vomiting.  HPI: Stacy Oconnell is a 29 y.o. female with medical history significant of asthma who is coming with complaints of RUQ pain, nausea and multiple episodes of emesis since Sunday (2 days ago).  She has been unable to tolerate much orally due to emesis.  She is jaundiced.  She denies diarrhea, constipation, melena or hematochezia.  No flank pain, dysuria, frequency or hematuria.  She denies fever, but complains of chills and fatigue.  No rhinorrhea, sore throat, wheezing, dyspnea or hemoptysis.  Denies chest pain, palpitations, diaphoresis, lower extremity edema.  No polyuria, polydipsia, polyphagia or blurred vision.  ED Course: Initial vital signs temperature 98.5 F, pulse 102, respiration 16, blood pressure 95/67 mmHg and O2 sat 100% on room air.  The patient received 2000 mL of NS bolus, ondansetron 4 mg IVP, dextrose 25 g IVP, famotidine 20 mg IVPB, and morphine 4 mg IVP x2 doses.  Urinalysis showed small hemoglobinuria, ketonuria 5 and proteinuria of 100 mg/dL.  Microscopic examination was unremarkable.  CBC was normal, except for a differential of 90% neutrophils with total WBC of 6.6.  Sodium 134, potassium 4.1, chloride 94 and CO2 23 mmol/L. Glucose was 61 mg/dL.  Renal function and calcium were normal.  Total protein 8.2, albumin 4.3 g/dL.  AST was 3570 and ALT 2395 units/L.  Total bilirubin was 4.5 and direct bilirubin 2.0 mg/dL.  Acetaminophen level was 11 mcg/mL.  Imaging: CT abdomen/pelvis showed mild fatty change of the liver.  Mild pericholecystic edema without evidence of calcified stones.  Ultrasound suggested.  Please see images and full radiology report for further detail.  Review of Systems: As per HPI  otherwise all other systems reviewed and are negative.  Past Medical History:  Diagnosis Date  . Asthma    Past Surgical History:  Procedure Laterality Date  . APPENDECTOMY    . LAPAROSCOPIC APPENDECTOMY  04/09/2012   Procedure: APPENDECTOMY LAPAROSCOPIC;  Surgeon: Jamesetta So, MD;  Location: [facility];  Service: General;  Laterality: N/A;  . MOUTH SURGERY     Teeth extracted   Social History  reports that she has been smoking cigarettes. She has a 2.00 pack-year smoking history. She has never used smokeless tobacco. She reports current alcohol use. She reports current drug use. Drug: Marijuana.  No Known Allergies  Family History  Problem Relation Age of Onset  . Gallbladder disease Mother   . Diabetes Mellitus II Father   . Kidney failure Father   . Hypertension Father    Prior to Admission medications   Medication Sig Start Date End Date Taking? Authorizing Provider  acetaminophen (TYLENOL) 500 MG tablet Take 500 mg by mouth every 6 (six) hours as needed for mild pain or moderate pain.   Yes [provider]   Physical Exam: Vitals:   09/20/19 2235 09/20/19 2236 09/20/19 2237 09/20/19 2238  BP:      Pulse: 86 80 81 79  Resp:      Temp:      TempSrc:      SpO2: 100% 100% 99% 100%  Weight:      Height:       Constitutional: NAD, calm, comfortable Eyes: PERRL, lids and conjunctivae normal ENMT: Mucous membranes are moist.  Posterior pharynx clear of any exudate or lesions. Neck: normal, supple, no masses, no thyromegaly Respiratory: clear to auscultation bilaterally, no wheezing, no crackles. Normal respiratory effort. No accessory muscle use.  Cardiovascular: Regular rate and rhythm, no murmurs / rubs / gallops. No extremity edema. 2+ pedal pulses. No carotid bruits.  Abdomen: Nondistended.  BS positive.  Soft, positive RUQ tenderness, no guarding or rebound, no masses palpated. No hepatosplenomegaly. Musculoskeletal: no clubbing / cyanosis. Good ROM, no  contractures. Normal muscle tone.  Skin: no rashes, lesions, ulcers. No induration Neurologic: CN 2-12 grossly intact. Sensation intact, DTR normal. Strength 5/5 in all 4.  Psychiatric: Normal judgment and insight. Alert and oriented x 3. Normal mood.   Labs on Admission: I have personally reviewed following labs and imaging studies  CBC: Recent Labs  Lab 09/20/19 1518  WBC 6.6  NEUTROABS 6.0  HGB 14.2  HCT 42.5  MCV 88.4  PLT 258   Basic Metabolic Panel: Recent Labs  Lab 09/20/19 1518  NA 134*  K 4.1  CL 94*  CO2 23  GLUCOSE 61*  BUN 11  CREATININE 0.72  CALCIUM 9.3   GFR: Estimated Creatinine Clearance: 81 mL/min (by C-G formula based on SCr of 0.72 mg/dL).  Liver Function Tests: Recent Labs  Lab 09/20/19 1518  AST 3,570*  ALT 2,395*  ALKPHOS 101  BILITOT 4.5*  PROT 8.2*  ALBUMIN 4.3   Urine analysis:    Component Value Date/Time   COLORURINE AMBER (A) 09/20/2019 1903   APPEARANCEUR HAZY (A) 09/20/2019 1903   LABSPEC 1.016 09/20/2019 1903   PHURINE 5.0 09/20/2019 1903   GLUCOSEU NEGATIVE 09/20/2019 1903   HGBUR SMALL (A) 09/20/2019 1903   BILIRUBINUR NEGATIVE 09/20/2019 1903   KETONESUR 5 (A) 09/20/2019 1903   PROTEINUR 100 (A) 09/20/2019 1903   UROBILINOGEN 0.2 04/07/2012 1530   NITRITE NEGATIVE 09/20/2019 1903   LEUKOCYTESUR NEGATIVE 09/20/2019 1903   Radiological Exams on Admission: CT ABDOMEN PELVIS W CONTRAST  Result Date: 09/20/2019 CLINICAL DATA:  Nausea and vomiting with abnormal liver function tests. Central abdominal pain. EXAM: CT ABDOMEN AND PELVIS WITH CONTRAST TECHNIQUE: Multidetector CT imaging of the abdomen and pelvis was performed using the standard protocol following bolus administration of intravenous contrast. CONTRAST:  75mL OMNIPAQUE IOHEXOL 300 MG/ML  SOLN COMPARISON:  04/07/2012 FINDINGS: Lower chest: Normal Hepatobiliary: Mild diffuse fatty change of the liver. Mild pericholecystic edema. No focal liver lesion. No calcified  gallstones. Pancreas: Normal Spleen: Normal Adrenals/Urinary Tract: Adrenal glands are normal. Kidneys are normal. Bladder is normal. Stomach/Bowel: No abnormal bowel finding. No ileus or obstruction. No inflammatory pathology evident. Previous appendectomy. Vascular/Lymphatic: Normal Reproductive: Retroverted uterus. Bilateral adrenal cysts, likely functional in a person of this age. No visible pelvic inflammatory change. Other: No free fluid or air. Musculoskeletal: Normal IMPRESSION: Mild fatty change of the liver. Mild pericholecystic edema without evidence of calcified gallstones. Consider right upper quadrant ultrasound if there is concern about potential cholecystitis. CT finding is not highly suspicious. No other abnormal finding. Electronically Signed   By: Paulina Fusi M.D.   On: 09/20/2019 20:29   EKG: Independently reviewed.  Assessment/Plan Principal Problem:   Cholestatic hepatitis Observation/MedSurg. Keep n.p.o. Continue IV fluids. Antiemetics as needed. Analgesics as needed. Continue famotidine 20 mg every 12 hours. Follow-up CBC and CMP. Follow-up acute hepatitis panel Obtain RUQ ultrasound. Consult general surgery. Consult gastroenterology.  Active Problems:   Hypoglycemia Continue dextrose infusion. Monitor CBG every 6 hours.    Asthma Supplemental oxygen as needed.  Bronchodilators as needed.    Hypophosphatemia Replacing. Follow-up level as needed.   DVT prophylaxis: SCDs. Code Status:   Full code. Family Communication: Disposition Plan:   Patient is from:  Home.  Anticipated DC to:  Home.  Anticipated DC date:  09/23/2019.  Anticipated DC barriers: Clinical improvement.  Consultants sign off.  Consults called:  Routine GI and general surgery consults. Admission status:  MedSurg/observation.   Severity of Illness: High with potential to become very high.  Bobette Mo MD [facility]  How to contact the [facility] Attending or Consulting  provider 7A - 7P or covering provider during after hours 7P -7A, for this patient?   1. Check the care team in [facility] and look for a) attending/consulting [facility] provider listed and b) the [facility] team listed 2. Log into [email or web address] and use [facility] [facility]'s universal password to access. If you do not have the password, please contact the hospital operator. 3. Locate the [facility] provider you are looking for under [facility] and page to a number that you can be directly reached. 4. If you still have difficulty reaching the provider, please page the [facility] (Director on Call) for the Hospitalists listed on amion for assistance.  09/20/2019, 11:04 PM   Document was prepared using Dragon voice recognition software and may contain some unintended transcription errors.

## 2019-09-20 NOTE — ED Triage Notes (Signed)
Abdominal pain with N/V x3 days.

## 2019-09-20 NOTE — ED Provider Notes (Signed)
[facility] EMERGENCY DEPARTMENT Provider Note   CSN: [id number] Arrival date & time: 09/20/19  1358     History Chief Complaint  Patient presents with  . Abdominal Pain    Stacy Oconnell is a 29 y.o. female with no significant past medical history, surgical history significant for appendectomy presenting with a 3-day history of epigastric and right upper abdominal pain in association with nausea and vomiting of yellow non bloody liquids. She has been unable to tolerate any p.o. intake for the past 48 hours.  She has had no fevers but does endorse episodes of chills.  She feels generally weak and lightheaded.  She denies diarrhea or constipation, also no abdominal distention.  No dysuria, no vaginal discharge.  Her abdominal pain started gradually but has become constant without radiation, specifically no radiation into her back or shoulder region.  Her pain is worsened with movement deep inspiration and palpation.  She describes the pain as sharp and cramping.  She has found no alleviators for her symptoms.  She denies EtOH use.  The history is provided by the patient.       Past Medical History:  Diagnosis Date  . Asthma     There are no problems to display for this patient.   Past Surgical History:  Procedure Laterality Date  . APPENDECTOMY    . LAPAROSCOPIC APPENDECTOMY  04/09/2012   Procedure: APPENDECTOMY LAPAROSCOPIC;  Surgeon: Dalia Heading, MD;  Location: [facility];  Service: General;  Laterality: N/A;  . MOUTH SURGERY     Teeth extracted     OB History   No obstetric history on file.     No family history on file.  Social History   Tobacco Use  . Smoking status: Current Every Day Smoker    Packs/day: 1.00    Years: 2.00    Pack years: 2.00    Types: Cigarettes  . Smokeless tobacco: Never Used  Substance Use Topics  . Alcohol use: Yes    Comment: occ   . Drug use: Yes    Types: Marijuana    Home Medications Prior to Admission medications     Medication Sig Start Date End Date Taking? Authorizing Provider  acetaminophen (TYLENOL) 500 MG tablet Take 500 mg by mouth every 6 (six) hours as needed for mild pain or moderate pain.   Yes [provider]    Allergies    Patient has no known allergies.  Review of Systems   Review of Systems  Constitutional: Positive for chills. Negative for fever.  HENT: Negative for congestion and sore throat.   Eyes: Negative.   Respiratory: Negative for chest tightness and shortness of breath.   Cardiovascular: Negative for chest pain.  Gastrointestinal: Positive for abdominal pain, nausea and vomiting. Negative for constipation and diarrhea.  Genitourinary: Positive for decreased urine volume. Negative for dysuria and vaginal discharge.  Musculoskeletal: Negative for arthralgias, joint swelling and neck pain.  Skin: Negative.  Negative for rash and wound.  Neurological: Negative for dizziness, weakness, light-headedness, numbness and headaches.  Psychiatric/Behavioral: Negative.     Physical Exam Updated Vital Signs BP (!) 89/57 (BP Location: Left Arm)   Pulse 100   Temp 98.1 F (36.7 C)   Resp 16   Ht 5\' 5"  (1.651 m)   Wt 49.9 kg   LMP 08/29/2019 (Approximate)   SpO2 100%   BMI 18.30 kg/m   Physical Exam Vitals and nursing note reviewed.  Constitutional:      Appearance:  She is well-developed.  HENT:     Head: Normocephalic and atraumatic.  Eyes:     Conjunctiva/sclera: Conjunctivae normal.  Cardiovascular:     Rate and Rhythm: Normal rate and regular rhythm.     Heart sounds: Normal heart sounds.  Pulmonary:     Breath sounds: Normal breath sounds. No wheezing.     Comments: Reduced respiratory effort as deep inspiration worsens abdominal pain. Abdominal:     General: Bowel sounds are normal. There is no distension.     Palpations: Abdomen is soft.     Tenderness: There is abdominal tenderness in the right upper quadrant and epigastric area. There is guarding.  There is no rebound.  Musculoskeletal:        General: Normal range of motion.     Cervical back: Normal range of motion.  Skin:    General: Skin is warm and dry.  Neurological:     Mental Status: She is alert.     ED Results / Procedures / Treatments   Labs (all labs ordered are listed, but only abnormal results are displayed) Labs Reviewed  COMPREHENSIVE METABOLIC PANEL - Abnormal; Notable for the following components:      Result Value   Sodium 134 (*)    Chloride 94 (*)    Glucose, Bld 61 (*)    Total Protein 8.2 (*)    AST 3,570 (*)    ALT 2,395 (*)    Total Bilirubin 4.5 (*)    Anion gap 17 (*)    All other components within normal limits  URINALYSIS, ROUTINE W REFLEX MICROSCOPIC - Abnormal; Notable for the following components:   Color, Urine AMBER (*)    APPearance HAZY (*)    Hgb urine dipstick SMALL (*)    Ketones, ur 5 (*)    Protein, ur 100 (*)    All other components within normal limits  DIFFERENTIAL - Abnormal; Notable for the following components:   Lymphs Abs 0.3 (*)    All other components within normal limits  LIPASE, BLOOD  CBC  ACETAMINOPHEN LEVEL  HEPATITIS PANEL, ACUTE  POC URINE PREG, ED    EKG None  Radiology CT ABDOMEN PELVIS W CONTRAST  Result Date: 09/20/2019 CLINICAL DATA:  Nausea and vomiting with abnormal liver function tests. Central abdominal pain. EXAM: CT ABDOMEN AND PELVIS WITH CONTRAST TECHNIQUE: Multidetector CT imaging of the abdomen and pelvis was performed using the standard protocol following bolus administration of intravenous contrast. CONTRAST:  39mL OMNIPAQUE IOHEXOL 300 MG/ML  SOLN COMPARISON:  04/07/2012 FINDINGS: Lower chest: Normal Hepatobiliary: Mild diffuse fatty change of the liver. Mild pericholecystic edema. No focal liver lesion. No calcified gallstones. Pancreas: Normal Spleen: Normal Adrenals/Urinary Tract: Adrenal glands are normal. Kidneys are normal. Bladder is normal. Stomach/Bowel: No abnormal bowel  finding. No ileus or obstruction. No inflammatory pathology evident. Previous appendectomy. Vascular/Lymphatic: Normal Reproductive: Retroverted uterus. Bilateral adrenal cysts, likely functional in a person of this age. No visible pelvic inflammatory change. Other: No free fluid or air. Musculoskeletal: Normal IMPRESSION: Mild fatty change of the liver. Mild pericholecystic edema without evidence of calcified gallstones. Consider right upper quadrant ultrasound if there is concern about potential cholecystitis. CT finding is not highly suspicious. No other abnormal finding. Electronically Signed   By: Nelson Chimes M.D.   On: 09/20/2019 20:29    Procedures Procedures (including critical care time)  Medications Ordered in ED Medications  ondansetron (ZOFRAN-ODT) disintegrating tablet 4 mg (4 mg Oral Given 09/20/19 1516)  sodium  chloride 0.9 % bolus 1,000 mL (0 mLs Intravenous Stopped 09/20/19 1900)  morphine 4 MG/ML injection 4 mg (4 mg Intravenous Given 09/20/19 1818)  ondansetron (ZOFRAN) injection 4 mg (4 mg Intravenous Given 09/20/19 1816)  famotidine (PEPCID) IVPB 20 mg premix (0 mg Intravenous Stopped 09/20/19 1900)  sodium chloride 0.9 % bolus 1,000 mL (0 mLs Intravenous Stopped 09/20/19 2008)  dextrose 50 % solution 50 mL (50 mLs Intravenous Given 09/20/19 1923)  iohexol (OMNIPAQUE) 300 MG/ML solution 75 mL (75 mLs Intravenous Contrast Given 09/20/19 2007)    ED Course  I have reviewed the triage vital signs and the nursing notes.  Pertinent labs & imaging results that were available during my care of the patient were reviewed by me and considered in my medical decision making (see chart for details).    MDM Rules/Calculators/A&P                          Labs reviewed and discussed with patient.  She has extremely elevated liver enzymes, elevated bilirubin, normal lipase.  CT scan is pending at this time.  She has been given IV fluids, morphine and Zofran with improvement in her nausea and  pain symptoms.  She was also given some IV D50 given hypoglycemia.  With significant elevated LFTs it is unclear whether this is an acute hepatitis versus acute cholecystitis.  She denies excessive Tylenol use, she took 4 tablets 3 days ago, 3 tablets yesterday.  She consumes rare EtOH and none in the past week.  She denies IV drug use.  She does have tattoos but were obtained at reputable tattoo establishments.  No risk factors for infectious hepatitis.  An acute hepatitis panel has been ordered however.  Patient aware she will need admission and is agreeable.  Awaiting CT scan at this time.  Patient discussed with Evelena Leyden, PA, who assumes care. Final Clinical Impression(s) / ED Diagnoses Final diagnoses:  Acute hepatitis  Pain of upper abdomen  Bilious vomiting with nausea    Rx / DC Orders ED Discharge Orders    None       Victoriano Lain 09/20/19 2033    Bethann Berkshire, MD 09/20/19 2229

## 2019-09-21 ENCOUNTER — Observation Stay (HOSPITAL_COMMUNITY): Payer: BC Managed Care – PPO

## 2019-09-21 DIAGNOSIS — J45909 Unspecified asthma, uncomplicated: Secondary | ICD-10-CM | POA: Diagnosis present

## 2019-09-21 DIAGNOSIS — Z20822 Contact with and (suspected) exposure to covid-19: Secondary | ICD-10-CM | POA: Diagnosis present

## 2019-09-21 DIAGNOSIS — K047 Periapical abscess without sinus: Secondary | ICD-10-CM | POA: Diagnosis present

## 2019-09-21 DIAGNOSIS — Z841 Family history of disorders of kidney and ureter: Secondary | ICD-10-CM | POA: Diagnosis not present

## 2019-09-21 DIAGNOSIS — Z833 Family history of diabetes mellitus: Secondary | ICD-10-CM | POA: Diagnosis not present

## 2019-09-21 DIAGNOSIS — D689 Coagulation defect, unspecified: Secondary | ICD-10-CM | POA: Diagnosis present

## 2019-09-21 DIAGNOSIS — Z8249 Family history of ischemic heart disease and other diseases of the circulatory system: Secondary | ICD-10-CM | POA: Diagnosis not present

## 2019-09-21 DIAGNOSIS — B179 Acute viral hepatitis, unspecified: Secondary | ICD-10-CM | POA: Diagnosis present

## 2019-09-21 DIAGNOSIS — R112 Nausea with vomiting, unspecified: Secondary | ICD-10-CM | POA: Diagnosis present

## 2019-09-21 DIAGNOSIS — R823 Hemoglobinuria: Secondary | ICD-10-CM | POA: Diagnosis present

## 2019-09-21 DIAGNOSIS — K7200 Acute and subacute hepatic failure without coma: Secondary | ICD-10-CM | POA: Diagnosis present

## 2019-09-21 DIAGNOSIS — R824 Acetonuria: Secondary | ICD-10-CM | POA: Diagnosis present

## 2019-09-21 DIAGNOSIS — E162 Hypoglycemia, unspecified: Secondary | ICD-10-CM | POA: Diagnosis present

## 2019-09-21 DIAGNOSIS — R809 Proteinuria, unspecified: Secondary | ICD-10-CM | POA: Diagnosis present

## 2019-09-21 DIAGNOSIS — R1114 Bilious vomiting: Secondary | ICD-10-CM | POA: Diagnosis present

## 2019-09-21 DIAGNOSIS — K7589 Other specified inflammatory liver diseases: Secondary | ICD-10-CM

## 2019-09-21 DIAGNOSIS — F17210 Nicotine dependence, cigarettes, uncomplicated: Secondary | ICD-10-CM | POA: Diagnosis present

## 2019-09-21 DIAGNOSIS — S3613XA Injury of bile duct, initial encounter: Secondary | ICD-10-CM

## 2019-09-21 LAB — BASIC METABOLIC PANEL
Anion gap: 9 (ref 5–15)
BUN: 9 mg/dL (ref 6–20)
CO2: 22 mmol/L (ref 22–32)
Calcium: 7.7 mg/dL — ABNORMAL LOW (ref 8.9–10.3)
Chloride: 105 mmol/L (ref 98–111)
Creatinine, Ser: 0.7 mg/dL (ref 0.44–1.00)
GFR calc Af Amer: >60 mL/min (ref >60)
GFR calc non Af Amer: >60 mL/min (ref >60)
Glucose, Bld: 122 mg/dL — ABNORMAL HIGH (ref 70–99)
Potassium: 3.8 mmol/L (ref 3.5–5.1)
Sodium: 136 mmol/L (ref 135–145)

## 2019-09-21 LAB — CBC WITH DIFFERENTIAL/PLATELET
Abs Immature Granulocytes: 0.03 10*3/uL (ref 0.00–0.07)
Basophils Absolute: 0 10*3/uL (ref 0.0–0.1)
Basophils Relative: 0 %
Eosinophils Absolute: 0.1 10*3/uL (ref 0.0–0.5)
Eosinophils Relative: 1 %
HCT: 35 % — ABNORMAL LOW (ref 36.0–46.0)
Hemoglobin: 11.4 g/dL — ABNORMAL LOW (ref 12.0–15.0)
Immature Granulocytes: 1 %
Lymphocytes Relative: 20 %
Lymphs Abs: 1.1 10*3/uL (ref 0.7–4.0)
MCH: 29.5 pg (ref 26.0–34.0)
MCHC: 32.6 g/dL (ref 30.0–36.0)
MCV: 90.7 fL (ref 80.0–100.0)
Monocytes Absolute: 0.2 10*3/uL (ref 0.1–1.0)
Monocytes Relative: 3 %
Neutro Abs: 4.1 10*3/uL (ref 1.7–7.7)
Neutrophils Relative %: 75 %
Platelets: 171 10*3/uL (ref 150–400)
RBC: 3.86 MIL/uL — ABNORMAL LOW (ref 3.87–5.11)
RDW: 12.7 % (ref 11.5–15.5)
WBC: 5.4 10*3/uL (ref 4.0–10.5)
nRBC: 0 % (ref 0.0–0.2)

## 2019-09-21 LAB — HEPATIC FUNCTION PANEL
ALT: 4268 U/L — ABNORMAL HIGH (ref 0–44)
ALT: 7580 U/L — ABNORMAL HIGH (ref 0–44)
AST: 7132 U/L — ABNORMAL HIGH (ref 15–41)
AST: >10000 U/L — ABNORMAL HIGH (ref 15–41)
Albumin: 3.3 g/dL — ABNORMAL LOW (ref 3.5–5.0)
Albumin: 3.3 g/dL — ABNORMAL LOW (ref 3.5–5.0)
Alkaline Phosphatase: 88 U/L (ref 38–126)
Alkaline Phosphatase: 100 U/L (ref 38–126)
Bilirubin, Direct: 1.8 mg/dL — ABNORMAL HIGH (ref 0.0–0.2)
Bilirubin, Direct: 2 mg/dL — ABNORMAL HIGH (ref 0.0–0.2)
Indirect Bilirubin: 1.8 mg/dL — ABNORMAL HIGH (ref 0.3–0.9)
Indirect Bilirubin: 1.4 mg/dL — ABNORMAL HIGH (ref 0.3–0.9)
Total Bilirubin: 3.6 mg/dL — ABNORMAL HIGH (ref 0.3–1.2)
Total Bilirubin: 3.4 mg/dL — ABNORMAL HIGH (ref 0.3–1.2)
Total Protein: 6.3 g/dL — ABNORMAL LOW (ref 6.5–8.1)
Total Protein: 6.3 g/dL — ABNORMAL LOW (ref 6.5–8.1)

## 2019-09-21 LAB — GLUCOSE, CAPILLARY: Glucose-Capillary: 153 mg/dL — ABNORMAL HIGH (ref 70–99)

## 2019-09-21 LAB — SEDIMENTATION RATE: Sed Rate: 16 mm/hr (ref 0–22)

## 2019-09-21 LAB — HEPATITIS PANEL, ACUTE
HCV Ab: NONREACTIVE
Hep A IgM: NONREACTIVE
Hep B C IgM: NONREACTIVE
Hepatitis B Surface Ag: NONREACTIVE

## 2019-09-21 LAB — MAGNESIUM: Magnesium: 2.1 mg/dL (ref 1.7–2.4)

## 2019-09-21 LAB — PROTIME-INR
INR: 3.7 — ABNORMAL HIGH (ref 0.8–1.2)
INR: 3.7 — ABNORMAL HIGH (ref 0.8–1.2)
Prothrombin Time: 35.3 seconds — ABNORMAL HIGH (ref 11.4–15.2)
Prothrombin Time: 35.4 seconds — ABNORMAL HIGH (ref 11.4–15.2)

## 2019-09-21 LAB — AMMONIA: Ammonia: 54 umol/L — ABNORMAL HIGH (ref 9–35)

## 2019-09-21 LAB — HIV ANTIBODY (ROUTINE TESTING W REFLEX): HIV Screen 4th Generation wRfx: NONREACTIVE

## 2019-09-21 LAB — C-REACTIVE PROTEIN: CRP: 1.4 mg/dL — ABNORMAL HIGH (ref <1.0)

## 2019-09-21 LAB — PHOSPHORUS: Phosphorus: 1.9 mg/dL — ABNORMAL LOW (ref 2.5–4.6)

## 2019-09-21 MED ORDER — DEXTROSE 5 % IV SOLN
15.0000 mg/kg/h | INTRAVENOUS | Status: DC
  Administered 2019-09-21: 15 mg/kg/h via INTRAVENOUS
  Filled 2019-09-21 (×3): qty 60

## 2019-09-21 MED ORDER — PROMETHAZINE HCL 25 MG/ML IJ SOLN
6.2500 mg | Freq: Four times a day (QID) | INTRAMUSCULAR | Status: DC | PRN
  Administered 2019-09-21: 6.25 mg via INTRAVENOUS
  Filled 2019-09-21: qty 1

## 2019-09-21 MED ORDER — FAMOTIDINE IN NACL 20-0.9 MG/50ML-% IV SOLN
20.0000 mg | Freq: Two times a day (BID) | INTRAVENOUS | Status: DC
  Administered 2019-09-21: 20 mg via INTRAVENOUS
  Filled 2019-09-21: qty 50

## 2019-09-21 MED ORDER — POTASSIUM PHOSPHATES 15 MMOLE/5ML IV SOLN
10.0000 mmol | Freq: Once | INTRAVENOUS | Status: AC
  Administered 2019-09-21: 10 mmol via INTRAVENOUS
  Filled 2019-09-21: qty 3.33

## 2019-09-21 MED ORDER — PROMETHAZINE HCL 25 MG/ML IJ SOLN
12.5000 mg | Freq: Four times a day (QID) | INTRAMUSCULAR | Status: DC | PRN
  Administered 2019-09-21: 12.5 mg via INTRAVENOUS
  Filled 2019-09-21: qty 1

## 2019-09-21 MED ORDER — VITAMIN K1 10 MG/ML IJ SOLN
10.0000 mg | Freq: Once | INTRAMUSCULAR | Status: AC
  Administered 2019-09-21: 10 mg via SUBCUTANEOUS
  Filled 2019-09-21: qty 1

## 2019-09-21 MED ORDER — ACETYLCYSTEINE LOAD VIA INFUSION
150.0000 mg/kg | Freq: Once | INTRAVENOUS | Status: AC
  Administered 2019-09-21: 7485 mg via INTRAVENOUS
  Filled 2019-09-21: qty 188

## 2019-09-21 MED ORDER — DEXTROSE 5 % IV SOLN: 15.0000 mg/kg/h | INTRAVENOUS | Status: DC

## 2019-09-21 MED ORDER — DEXTROSE-NACL 5-0.9 % IV SOLN: INTRAVENOUS | Status: DC

## 2019-09-21 NOTE — Progress Notes (Signed)
Patient is sitting in bed alert and oriented at this time. Received call from patient's mother who isn't on the contact list. Patient gave verbal consent to speak with her mother regarding her medical condition.

## 2019-09-21 NOTE — Consult Note (Addendum)
GI Inpatient Consult Note  Reason for Consult: RUQ pain, vomiting, elevated LFTS   Attending Requesting Consult: Dr Olevia Bowens  History of Present Illness: Stacy Oconnell is a 29 y.o. female seen for evaluation of elevated LFTS at the request of Dr. Olevia Bowens.    Patient reports in late May developing issues with a dental abscess following a tooth extraction.  At this time she was given clindamycin 300 mg twice a day.  She took this for several days until the pain resolved.  She did not complete the full clindamycin course.  Issues with tooth pain returned last week and started taking the rest of the clindamycin as well as six 800 mg ibuprofen and approximately 4-6 Tylenol per day.  A few days later she developed some generalized fatigue with nausea, vomiting, right upper quadrant pain.  Initially thought right upper quadrant pain was muscular from straining during vomiting. Vomiting bile like substance with limited oral intake.  She denies any chronic issues with nausea vomiting, GERD, etc.  She had one episode of diarrhea in late May after traveling to [place] and eating some "bad food" but diarrhea resolved quickly and otherwise denies any lower GI symptoms of nausea, vomiting, diarrhea, lower abdominal pain.  She denies any prior history of similar symptoms.  Denies any family history of liver disease.  Drinks alcohol 2-3 drinks on the weekends.  She smokes about 1 pack/week.  She denies any illegal drug use.  She denies any over-the-counter supplements.  Only past medical history is asthma which is well controlled without frequent use of albuterol.  She denies any joint pains or rashes recently.  Last Colonoscopy: none prior Last Endoscopy: none prior    Past Medical History:  Past Medical History:  Diagnosis Date  . Asthma     Problem List: Patient Active Problem List   Diagnosis Date Noted  . Hypophosphatemia 09/21/2019  . 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 Asthma 09/20/2019    . Hypoglycemia 09/20/2019    Past Surgical History: Past Surgical History:  Procedure Laterality Date  . APPENDECTOMY    . LAPAROSCOPIC APPENDECTOMY  04/09/2012   Procedure: APPENDECTOMY LAPAROSCOPIC;  Surgeon: Jamesetta So, MD;  Location: [facility];  Service: General;  Laterality: N/A;  . MOUTH SURGERY     Teeth extracted    Allergies: No Known Allergies  Home Medications: (Not in a hospital admission)  Home medication reconciliation was completed with the patient.   Scheduled Inpatient Medications:    Continuous Inpatient Infusions:   . dextrose 5 % and 0.9% NaCl 88 mL/hr at 09/21/19 0745  . famotidine    . potassium PHOSPHATE IVPB (in mmol)      PRN Inpatient Medications:  morphine injection, ondansetron **OR** ondansetron (ZOFRAN) IV  Family History: family history includes Diabetes Mellitus II in her father; Gallbladder disease in her mother; Hypertension in her father; Kidney failure in her father.    Social History:   reports that she has been smoking cigarettes. She has a 2.00 pack-year smoking history. She has never used smokeless tobacco. She reports current alcohol use. She reports current drug use. Drug: Marijuana.   Review of Systems: Constitutional: Weight is stable.  Eyes: No changes in vision. ENT: No oral lesions, sore throat.  GI: see HPI.  Heme/Lymph: No easy bruising.  CV: No chest pain.  GU: No hematuria.  Integumentary: No rashes.  Neuro: No headaches.  Psych: No depression/anxiety.  Endocrine: No heat/cold intolerance.  Allergic/Immunologic: No urticaria.  Resp:  No cough, SOB.  Musculoskeletal: No joint swelling.    Physical Examination: BP 121/83   Pulse 66   Temp 98.1 F (36.7 C)   Resp 17   Ht 5\' 5"  (1.651 m)   Wt 49.9 kg   LMP 08/29/2019 (Approximate)   SpO2 100%   BMI 18.30 kg/m  Gen: NAD, alert and oriented x 4.  HEENT: PEERLA, EOMI, +Thrush Neck: supple, no JVD or thyromegaly Chest: CTA bilaterally, no wheezes, crackles,  or other adventitious sounds CV: RRR, no m/g/c/r Abd: soft, TTP RUQ, ND, +BS in all four quadrants; no HSM, guarding, ridigity, or rebound tenderness Ext: no edema, well perfused with 2+ pulses, Skin: no rash or lesions noted Lymph: no LAD  Data: Lab Results  Component Value Date   WBC 5.4 09/21/2019   HGB 11.4 (L) 09/21/2019   HCT 35.0 (L) 09/21/2019   MCV 90.7 09/21/2019   PLT 171 09/21/2019   Recent Labs  Lab 09/20/19 1518 09/21/19 0413  HGB 14.2 11.4*   Lab Results  Component Value Date   NA 136 09/21/2019   K 3.8 09/21/2019   CL 105 09/21/2019   CO2 22 09/21/2019   BUN 9 09/21/2019   CREATININE 0.70 09/21/2019   Lab Results  Component Value Date   ALT 4,268 (H) 09/21/2019   AST 7,132 (H) 09/21/2019   ALKPHOS 88 09/21/2019   BILITOT 3.6 (H) 09/21/2019   No results for input(s): APTT, INR, PTT in the last 168 hours.  ED labs - Lipase normal, ALT 2395, AST 3570, glucose 61, albumin 4.3, T bili 4.5 (direct bili 2.0), normal acetaminophen level. UA w/ amber urine, hazy, small Hgb, 5 ketones, 100 protein.  CT 09/20/19-IMPRESSION: Mild fatty change of the liver. Mild pericholecystic edema without evidence of calcified gallstones. Consider right upper quadrant ultrasound if there is concern about potential cholecystitis. CT finding is not highly suspicious. No other abnormal finding.   Assessment/Plan: Stacy Oconnell is a 29 y.o. female admitted for right upper quadrant pain, n/v with significantly elevated transaminases & elevated direct bili as well.  CT as above and ultrasound result is still pending.  Differential would include drug-induced liver injury (acetaminophen, clindamycin), autoimmune hepatitis.  Viral serologies currently pending.  We will check autoimmune markers and a daily INR.  Dr. 26 to discuss Mucomyst w/ pharmacy (acetaminophen level was 11). Okay to advance diet to full liquids.    Plan:  1. INR, sed rate, crp, ANA, smooth muscle Ab,  mitochondrial ab, immunoglobulin panel.  2. Full liquid diet  3. Dr Karilyn Cota to discuss mucomyst w/ pharmacy  Addendum 09/21/19 10:13am - case discussed w/ pharmacy. Plan to repeat LFTs at 2pm today. If continue to increase will tx w/ mucomyst  Case was discussed with Dr. 09/23/19. Thank you for the consult. Please call with questions or concerns.  Karilyn Cota, PA-C [facility]

## 2019-09-21 NOTE — Plan of Care (Signed)

## 2019-09-21 NOTE — Progress Notes (Signed)
[facility] in to transport patient to [facility]. Patient is alert and oriented at this time. No apparent distress voiced. CBG:154. No nausea or vomiting. No c/o pain or discomfort.

## 2019-09-21 NOTE — Progress Notes (Addendum)
PROGRESS NOTE    Stacy Oconnell  ZOX:[id number] DOB: 04-Jun-1990 DOA: 09/20/2019 PCP: Patient, No Pcp Per    Brief Narrative:  Stacy Oconnell is a 29 y.o. female with medical history significant of asthma who is coming with complaints of RUQ pain, nausea and multiple episodes of emesis since Sunday (2 days ago).  She has been unable to tolerate much orally due to emesis.  She is jaundiced.  She denies diarrhea, constipation, melena or hematochezia.  No flank pain, dysuria, frequency or hematuria.  She denies fever, but complains of chills and fatigue.  No rhinorrhea, sore throat, wheezing, dyspnea or hemoptysis.  Denies chest pain, palpitations, diaphoresis, lower extremity edema.  No polyuria, polydipsia, polyphagia or blurred vision.  Initial vital signs temperature 98.5 F, pulse 102, respiration 16, blood pressure 95/67 mmHg and O2 sat 100% on room air.  The patient received 2000 mL of NS bolus, ondansetron 4 mg IVP, dextrose 25 g IVP, famotidine 20 mg IVPB, and morphine 4 mg IVP x2 doses.  Urinalysis showed small hemoglobinuria, ketonuria 5 and proteinuria of 100 mg/dL.  Microscopic examination was unremarkable.  CBC was normal, except for a differential of 90% neutrophils with total WBC of 6.6.  Sodium 134, potassium 4.1, chloride 94 and CO2 23 mmol/L. Glucose was 61 mg/dL.  Renal function and calcium were normal.  Total protein 8.2, albumin 4.3 g/dL.  AST was 3570 and ALT 2395 units/L.  Total bilirubin was 4.5 and direct bilirubin 2.0 mg/dL.  Acetaminophen level was 11 mcg/mL.  CT abdomen/pelvis showed mild fatty change of the liver.  Mild pericholecystic edema without evidence of calcified stones.  Ultrasound suggested.  Please see images and full radiology report for further detail.   Assessment & Plan:   Principal Problem:   Cholestatic hepatitis Active Problems:   Asthma   Hypoglycemia   Hypophosphatemia   Acute hepatitis   Acute hepatic failure Patient presenting from  home with acute and progressive abdominal pain localized to the right upper quadrant with associated nausea and vomiting over the previous 2 days.  Very poor oral intake over this timeframe and noted skin discoloration.  She has been utilizing some Tylenol, ibuprofen, aspirin during this timeframe.  Patient with recent dental infection/abscess and currently on clindamycin.  Labs concerning with elevated LFTs; AST 3570, ALT 2391 with a total bilirubin of 4.5.  CT abdomen/pelvis notable for fatty changes of the liver with mild pericholecystic edema without calcified gallstones.  Tylenol level 11.  Urinalysis negative.  Acute hepatitis panel negative.  ESR 16, CRP 1.4. --Gastroenterology following, Dr. Doree Barthel; appreciate assistance --AST [phone] --ALT 2956>2130 --Tbili 4.5>3.6 --ANA, smooth muscle antibody, mitochondrial antibody, immunoglobin panel: Pending --Starting Mucomyst --Continue to monitor LFTs daily --Have reached out to [facility] for transfer consideration of acute hepatic failure with concern may need liver transplant given severely progressing hepatic failure.  Awaiting callback from hepatologist, Dr. Cornelia Copa.  Coagulopathy INR 3.7; likely secondary to liver failure as above.  No signs of active bleeding --Status post vitamin K 10 mg subcutaneously today --Continue monitor INR daily  Asthma Oxygenating well on room air, no shortness of breath. --Bronchodilators as needed  Hypophosphatemia. --Replacing phosphorus --Repeat phosphorus level in a.m.   DVT prophylaxis: SCDs Code Status: Full code Family Communication: Family present at bedside  Disposition Plan:  Status is: Inpatient  Remains inpatient appropriate because:Ongoing active pain requiring inpatient pain management, Ongoing diagnostic testing needed not appropriate for outpatient work up, Unsafe d/c plan, IV  treatments appropriate due to intensity of illness or inability to take PO and  Inpatient level of care appropriate due to severity of illness acute hepatic failure, GI recommending transfer to a tertiary center.  Awaiting to hear back from hepatologist from [facility].   Dispo: The patient is from: Home              Anticipated d/c is to: Likely transfer to [facility] for acute hepatic failure              Anticipated d/c date is: 3 days              Patient currently is not medically stable to d/c.        Consultants:   Gastroenterology, Dr. Doree Barthel  Procedures:   none  Antimicrobials:   none   Subjective: Patient seen and examined at bedside, uncomfortable in appearance.  Continues to complain of epigastric and right upper quadrant abdominal pain.  Unable to get comfortable in bed.  No other complaints at this time.  Denies headache, no fever/chills/night sweats, no chest pain, no palpitations, no shortness of breath, no weakness, no fatigue, no paresthesias.  No acute events overnight per nursing staff.  Discussed case with gastroenterology, recommends transfer to a tertiary center given acute hepatic failure with doubling of her transaminases and less than 24-hour period.  Awaiting to hear back from [facility] hepatologist.  GI starting Mucomyst and vitamin K.  Objective: Vitals:   09/21/19 0604 09/21/19 0609 09/21/19 0614 09/21/19 0930  BP:    125/90  Pulse: (!) 56 (!) 54 66 70  Resp:    16  Temp:    97.9 F (36.6 C)  TempSrc:    Oral  SpO2: (!) 81% 100% 100% 98%  Weight:      Height:       No intake or output data in the 24 hours ending 09/21/19 1510 Filed Weights   09/20/19 1507  Weight: 49.9 kg    Examination:  General exam: Calm but uncomfortable in appearance with persistent abdominal discomfort Respiratory system: Clear to auscultation. Respiratory effort normal.  Oxygenating well on room air Cardiovascular system: S1 & S2 heard, RRR. No JVD, murmurs, rubs, gallops or clicks. No pedal edema. Gastrointestinal  system: Abdomen is nondistended, soft, tender to epigastrium and right upper quadrant . No organomegaly or masses felt. Normal bowel sounds heard. Central nervous system: Alert and oriented. No focal neurological deficits. Extremities: Symmetric 5 x 5 power. Skin: No rashes, lesions or ulcers Psychiatry: Judgement and insight appear normal. Mood & affect appropriate.     Data Reviewed: I have personally reviewed following labs and imaging studies  CBC: Recent Labs  Lab 09/20/19 1518 09/21/19 0413  WBC 6.6 5.4  NEUTROABS 6.0 4.1  HGB 14.2 11.4*  HCT 42.5 35.0*  MCV 88.4 90.7  PLT 258 171   Basic Metabolic Panel: Recent Labs  Lab 09/20/19 1518 09/21/19 0413  NA 134* 136  K 4.1 3.8  CL 94* 105  CO2 23 22  GLUCOSE 61* 122*  BUN 11 9  CREATININE 0.72 0.70  CALCIUM 9.3 7.7*  MG  --  2.1  PHOS  --  1.9*   GFR: Estimated Creatinine Clearance: 81 mL/min (by C-G formula based on SCr of 0.7 mg/dL). Liver Function Tests: Recent Labs  Lab 09/20/19 1518 09/21/19 0413  AST 3,570* 7,132*  ALT 2,395* 4,268*  ALKPHOS 101 88  BILITOT 4.5* 3.6*  PROT 8.2* 6.3*  ALBUMIN  4.3 3.3*   Recent Labs  Lab 09/20/19 1518  LIPASE 19   No results for input(s): AMMONIA in the last 168 hours. Coagulation Profile: Recent Labs  Lab 09/21/19 0953  INR 3.7*   Cardiac Enzymes: No results for input(s): CKTOTAL, CKMB, CKMBINDEX, TROPONINI in the last 168 hours. BNP (last 3 results) No results for input(s): PROBNP in the last 8760 hours. HbA1C: No results for input(s): HGBA1C in the last 72 hours. CBG: No results for input(s): GLUCAP in the last 168 hours. Lipid Profile: No results for input(s): CHOL, HDL, LDLCALC, TRIG, CHOLHDL, LDLDIRECT in the last 72 hours. Thyroid Function Tests: No results for input(s): TSH, T4TOTAL, FREET4, T3FREE, THYROIDAB in the last 72 hours. Anemia Panel: No results for input(s): VITAMINB12, FOLATE, FERRITIN, TIBC, IRON, RETICCTPCT in the last 72  hours. Sepsis Labs: No results for input(s): PROCALCITON, LATICACIDVEN in the last 168 hours.  Recent Results (from the past 240 hour(s))  SARS Coronavirus 2 by RT PCR (hospital order, performed in [facility] hospital lab) Nasopharyngeal Nasopharyngeal Swab     Status: None   Collection Time: 09/20/19  9:44 PM   Specimen: Nasopharyngeal Swab  Result Value Ref Range Status   SARS Coronavirus 2 NEGATIVE NEGATIVE Final    Comment: (NOTE) SARS-CoV-2 target nucleic acids are NOT DETECTED.  The SARS-CoV-2 RNA is generally detectable in upper and lower respiratory specimens during the acute phase of infection. The lowest concentration of SARS-CoV-2 viral copies this assay can detect is 250 copies / mL. A negative result does not preclude SARS-CoV-2 infection and should not be used as the sole basis for treatment or other patient management decisions.  A negative result may occur with improper specimen collection / handling, submission of specimen other than nasopharyngeal swab, presence of viral mutation(s) within the areas targeted by this assay, and inadequate number of viral copies (<250 copies / mL). A negative result must be combined with clinical observations, patient history, and epidemiological information.  Fact Sheet for Patients:   BoilerBrush.com.cy  Fact Sheet for Healthcare Providers: https://pope.com/  This test is not yet approved or  cleared by the [place] FDA and has been authorized for detection and/or diagnosis of SARS-CoV-2 by FDA under an Emergency Use Authorization (EUA).  This EUA will remain in effect (meaning this test can be used) for the duration of the COVID-19 declaration under Section 564(b)(1) of the Act, 21 U.S.C. section 360bbb-3(b)(1), unless the authorization is terminated or revoked sooner.  Performed at [facility], [address], [place], [place] [address]          Radiology  Studies: CT ABDOMEN PELVIS W CONTRAST  Result Date: 09/20/2019 CLINICAL DATA:  Nausea and vomiting with abnormal liver function tests. Central abdominal pain. EXAM: CT ABDOMEN AND PELVIS WITH CONTRAST TECHNIQUE: Multidetector CT imaging of the abdomen and pelvis was performed using the standard protocol following bolus administration of intravenous contrast. CONTRAST:  75mL OMNIPAQUE IOHEXOL 300 MG/ML  SOLN COMPARISON:  04/07/2012 FINDINGS: Lower chest: Normal Hepatobiliary: Mild diffuse fatty change of the liver. Mild pericholecystic edema. No focal liver lesion. No calcified gallstones. Pancreas: Normal Spleen: Normal Adrenals/Urinary Tract: Adrenal glands are normal. Kidneys are normal. Bladder is normal. Stomach/Bowel: No abnormal bowel finding. No ileus or obstruction. No inflammatory pathology evident. Previous appendectomy. Vascular/Lymphatic: Normal Reproductive: Retroverted uterus. Bilateral adrenal cysts, likely functional in a person of this age. No visible pelvic inflammatory change. Other: No free fluid or air. Musculoskeletal: Normal IMPRESSION: Mild fatty change of the liver.  Mild pericholecystic edema without evidence of calcified gallstones. Consider right upper quadrant ultrasound if there is concern about potential cholecystitis. CT finding is not highly suspicious. No other abnormal finding. Electronically Signed   By: Paulina Fusi M.D.   On: 09/20/2019 20:29   US Abdomen Limited RUQ  Result Date: 09/21/2019 CLINICAL DATA:  Nausea, vomiting and cholestatic hepatitis for 3 days. EXAM: ULTRASOUND ABDOMEN LIMITED RIGHT UPPER QUADRANT COMPARISON:  09/20/2019 CT abdomen/pelvis FINDINGS: Gallbladder: No gallstones. Mild eccentric smooth gallbladder wall thickening adjacent to the liver. No gallbladder distention. No pericholecystic fluid. Sonographic Eulah Pont sign is reported. Common bile duct: Diameter: 2 mm Liver: No focal lesion identified. Within normal limits in parenchymal echogenicity.  Portal vein is patent on color Doppler imaging with normal direction of blood flow towards the liver. Other: None. IMPRESSION: 1. Mild smooth eccentric gallbladder wall thickening adjacent to the liver, probably reactive. No cholelithiasis. 2. No biliary ductal dilatation. 3. Normal liver. Electronically Signed   By: Delbert Phenix M.D.   On: 09/21/2019 08:33        Scheduled Meds: . acetylcysteine  150 mg/kg Intravenous Once   Continuous Infusions: . acetylcysteine    . dextrose 5 % and 0.9% NaCl Stopped (09/21/19 0842)  . famotidine 20 mg (09/21/19 1000)  . potassium PHOSPHATE IVPB (in mmol) 10 mmol (09/21/19 1445)     LOS: 0 days    Time spent: 58 minutes spent on chart review, discussion with nursing staff, consultants, updating family and interview/physical exam; more than 50% of that time was spent in counseling and/or coordination of care.    Alvira Philips [place], DO [facility] Available via Epic secure chat 7am-7pm After these hours, please refer to coverage provider listed on [email or web address] 09/21/2019, 3:10 PM

## 2019-09-21 NOTE — Progress Notes (Signed)
Attempted to call in report to [facility] at [phone], no answer. Will attempt at later time or pass on to night nurse.

## 2019-09-21 NOTE — Consult Note (Signed)
GI note reviewed.  Chart reviewed.  No need for surgical consultation at this point.  Please call me if I can be of further assistance.

## 2019-09-21 NOTE — Discharge Summary (Signed)
Physician Discharge Summary  Stacy Oconnell:[id number] DOB: 1990-10-06 DOA: 09/20/2019  PCP: Patient, No Pcp Per  Admit date: 09/20/2019 Discharge date: 09/21/2019  Admitted From: Home Disposition: Transfer to [facility], accepting physician, Dr. Linward Natal, MD   Discharge Condition: Guarded CODE STATUS: Full code  History of present illness:  Stacy Oconnell a 29 y.o.femalewith medical history significant ofasthma who is coming with complaints of RUQ pain, nausea and multiple episodes of emesis since Sunday (2 days ago). She has been unable to tolerate much orally due to emesis. She is jaundiced. She denies diarrhea, constipation, melena or hematochezia. No flank pain, dysuria, frequency or hematuria. She denies fever, but complains of chills and fatigue. No rhinorrhea,sore throat, wheezing, dyspnea or hemoptysis. Denies chest pain, palpitations, diaphoresis, lower extremity edema. No polyuria, polydipsia, polyphagia or blurred vision.  Initial vital signs temperature 98.5 F, pulse 102, respiration 16, blood pressure 95/67 mmHg and O2 sat 100% on room air. The patient received 2000 mL of NS bolus, ondansetron 4 mg IVP, dextrose 25 g IVP, famotidine 20 mg IVPB, and morphine 4 mg IVP x2 doses.  Urinalysis showed small hemoglobinuria, ketonuria 5 and proteinuria of 100 mg/dL. Microscopic examination was unremarkable. CBC was normal, except for a differential of 90% neutrophils with total WBC of 6.6. Sodium 134, potassium 4.1, chloride 94 and CO2 23 mmol/L. Glucose was 61 mg/dL. Renal function and calcium were normal. Total protein 8.2, albumin 4.3 g/dL. AST was 3570 and ALT 2395 units/L. Total bilirubin was 4.5 and direct bilirubin 2.0 mg/dL. Acetaminophen level was 11 mcg/mL.  CT abdomen/pelvis showed mild fatty change of the liver. Mild pericholecystic edema without evidence of calcified stones.Ultrasound suggested. Please see images  and full radiology report for further detail.  Hospital course:  Acute hepatic failure Patient presenting from home with acute and progressive abdominal pain localized to the right upper quadrant with associated nausea and vomiting over the previous 2 days.  Very poor oral intake over this timeframe and noted skin discoloration.  She has been utilizing some Tylenol, ibuprofen, aspirin during this timeframe.  Patient with recent dental infection/abscess and currently on clindamycin.  Labs concerning with elevated LFTs; AST 3570, ALT 2391 with a total bilirubin of 4.5.  CT abdomen/pelvis notable for fatty changes of the liver with mild pericholecystic edema without calcified gallstones.  Tylenol level 11. Urinalysis negative.  Acute hepatitis panel negative.  ESR 16, CRP 1.4. --Gastroenterology following, Dr. Alease Frame; appreciate assistance --AST [phone] --ALT [phone] --Tbili 4.5>3.6 --INR 3.7 --ANA, smooth muscle antibody, mitochondrial antibody, immunoglobin panel, alpha-1 antitrypsin, ceruloplasmin: Pending --Starting Mucomyst --Continue to monitor LFTs daily  Reached out to [facility] for transfer consideration of acute hepatic failure with concern may need liver transplant given severely progressing hepatic failure.  Case was discussed with hospitalist, Dr. Linward Natal, MD who accepted patient in transfer.  Coagulopathy INR 3.7; likely secondary to liver failure as above.  No signs of active bleeding --Status post vitamin K 10 mg subcutaneously today --Continue monitor INR daily  Asthma Oxygenating well on room air, no shortness of breath. --Bronchodilators as needed  Hypophosphatemia. --Replacing phosphorus --Repeat phosphorus level in a.m.  Discharge Diagnoses:  Principal Problem:   Cholestatic hepatitis Active Problems:   Asthma   Hypoglycemia   Hypophosphatemia   Acute hepatitis    Discharge Instructions  Discharge Instructions    Diet -  low sodium heart healthy   Complete by: As directed    Increase activity slowly  Physician Discharge Summary  Stacy Oconnell Oconnell:[id number] DOB: [birth year] DOA: 09/20/2019  PCP: Patient, No Pcp Per  Admit date: 09/20/2019 Discharge date: 09/21/2019  Admitted From: Home Disposition: Transfer to [facility], accepting physician, Dr. Linward Natal, MD   Discharge Condition: Guarded CODE STATUS: Full code  History of present illness:  Stacy Oconnell a 29 y.o.femalewith medical history significant ofasthma who is coming with complaints of RUQ pain, nausea and multiple episodes of emesis since Sunday (2 days ago). She has been unable to tolerate much orally due to emesis. She is jaundiced. She denies diarrhea, constipation, melena or hematochezia. No flank pain, dysuria, frequency or hematuria. She denies fever, but complains of chills and fatigue. No rhinorrhea,sore throat, wheezing, dyspnea or hemoptysis. Denies chest pain, palpitations, diaphoresis, lower extremity edema. No polyuria, polydipsia, polyphagia or blurred vision.  Initial vital signs temperature 98.5 F, pulse 102, respiration 16, blood pressure 95/67 mmHg and O2 sat 100% on room air. The patient received 2000 mL of NS bolus, ondansetron 4 mg IVP, dextrose 25 g IVP, famotidine 20 mg IVPB, and morphine 4 mg IVP x2 doses.  Urinalysis showed small hemoglobinuria, ketonuria 5 and proteinuria of 100 mg/dL. Microscopic examination was unremarkable. CBC was normal, except for a differential of 90% neutrophils with total WBC of 6.6. Sodium 134, potassium 4.1, chloride 94 and CO2 23 mmol/L. Glucose was 61 mg/dL. Renal function and calcium were normal. Total protein 8.2, albumin 4.3 g/dL. AST was 3570 and ALT 2395 units/L. Total bilirubin was 4.5 and direct bilirubin 2.0 mg/dL. Acetaminophen level was 11 mcg/mL.  CT abdomen/pelvis showed mild fatty change of the liver. Mild pericholecystic edema without evidence of calcified stones.Ultrasound suggested. Please see images  and full radiology report for further detail.  Hospital course:  Acute hepatic failure Patient presenting from home with acute and progressive abdominal pain localized to the right upper quadrant with associated nausea and vomiting over the previous 2 days.  Very poor oral intake over this timeframe and noted skin discoloration.  She has been utilizing some Tylenol, ibuprofen, aspirin during this timeframe.  Patient with recent dental infection/abscess and currently on clindamycin.  Labs concerning with elevated LFTs; AST 3570, ALT 2391 with a total bilirubin of 4.5.  CT abdomen/pelvis notable for fatty changes of the liver with mild pericholecystic edema without calcified gallstones.  Tylenol level 11. Urinalysis negative.  Acute hepatitis panel negative.  ESR 16, CRP 1.4. --Gastroenterology following, Dr. Alease Frame; appreciate assistance --AST [phone] --ALT [phone] --Tbili 4.5>3.6 --INR 3.7 --ANA, smooth muscle antibody, mitochondrial antibody, immunoglobin panel, alpha-1 antitrypsin, ceruloplasmin: Pending --Starting Mucomyst --Continue to monitor LFTs daily  Reached out to [facility] for transfer consideration of acute hepatic failure with concern may need liver transplant given severely progressing hepatic failure.  Case was discussed with hospitalist, Dr. Linward Natal, MD who accepted patient in transfer.  Coagulopathy INR 3.7; likely secondary to liver failure as above.  No signs of active bleeding --Status post vitamin K 10 mg subcutaneously today --Continue monitor INR daily  Asthma Oxygenating well on room air, no shortness of breath. --Bronchodilators as needed  Hypophosphatemia. --Replacing phosphorus --Repeat phosphorus level in a.m.  Discharge Diagnoses:  Principal Problem:   Cholestatic hepatitis Active Problems:   Asthma   Hypoglycemia   Hypophosphatemia   Acute hepatitis    Discharge Instructions  Discharge Instructions    Diet -  low sodium heart healthy   Complete by: As directed    Increase activity slowly  Physician Discharge Summary  Stacy Oconnell:[id number] DOB: 1990-10-06 DOA: 09/20/2019  PCP: Patient, No Pcp Per  Admit date: 09/20/2019 Discharge date: 09/21/2019  Admitted From: Home Disposition: Transfer to [facility], accepting physician, Dr. Linward Natal, MD   Discharge Condition: Guarded CODE STATUS: Full code  History of present illness:  Stacy Oconnell a 29 y.o.femalewith medical history significant ofasthma who is coming with complaints of RUQ pain, nausea and multiple episodes of emesis since Sunday (2 days ago). She has been unable to tolerate much orally due to emesis. She is jaundiced. She denies diarrhea, constipation, melena or hematochezia. No flank pain, dysuria, frequency or hematuria. She denies fever, but complains of chills and fatigue. No rhinorrhea,sore throat, wheezing, dyspnea or hemoptysis. Denies chest pain, palpitations, diaphoresis, lower extremity edema. No polyuria, polydipsia, polyphagia or blurred vision.  Initial vital signs temperature 98.5 F, pulse 102, respiration 16, blood pressure 95/67 mmHg and O2 sat 100% on room air. The patient received 2000 mL of NS bolus, ondansetron 4 mg IVP, dextrose 25 g IVP, famotidine 20 mg IVPB, and morphine 4 mg IVP x2 doses.  Urinalysis showed small hemoglobinuria, ketonuria 5 and proteinuria of 100 mg/dL. Microscopic examination was unremarkable. CBC was normal, except for a differential of 90% neutrophils with total WBC of 6.6. Sodium 134, potassium 4.1, chloride 94 and CO2 23 mmol/L. Glucose was 61 mg/dL. Renal function and calcium were normal. Total protein 8.2, albumin 4.3 g/dL. AST was 3570 and ALT 2395 units/L. Total bilirubin was 4.5 and direct bilirubin 2.0 mg/dL. Acetaminophen level was 11 mcg/mL.  CT abdomen/pelvis showed mild fatty change of the liver. Mild pericholecystic edema without evidence of calcified stones.Ultrasound suggested. Please see images  and full radiology report for further detail.  Hospital course:  Acute hepatic failure Patient presenting from home with acute and progressive abdominal pain localized to the right upper quadrant with associated nausea and vomiting over the previous 2 days.  Very poor oral intake over this timeframe and noted skin discoloration.  She has been utilizing some Tylenol, ibuprofen, aspirin during this timeframe.  Patient with recent dental infection/abscess and currently on clindamycin.  Labs concerning with elevated LFTs; AST 3570, ALT 2391 with a total bilirubin of 4.5.  CT abdomen/pelvis notable for fatty changes of the liver with mild pericholecystic edema without calcified gallstones.  Tylenol level 11. Urinalysis negative.  Acute hepatitis panel negative.  ESR 16, CRP 1.4. --Gastroenterology following, Dr. Alease Frame; appreciate assistance --AST [phone] --ALT [phone] --Tbili 4.5>3.6 --INR 3.7 --ANA, smooth muscle antibody, mitochondrial antibody, immunoglobin panel, alpha-1 antitrypsin, ceruloplasmin: Pending --Starting Mucomyst --Continue to monitor LFTs daily  Reached out to [facility] for transfer consideration of acute hepatic failure with concern may need liver transplant given severely progressing hepatic failure.  Case was discussed with hospitalist, Dr. Linward Natal, MD who accepted patient in transfer.  Coagulopathy INR 3.7; likely secondary to liver failure as above.  No signs of active bleeding --Status post vitamin K 10 mg subcutaneously today --Continue monitor INR daily  Asthma Oxygenating well on room air, no shortness of breath. --Bronchodilators as needed  Hypophosphatemia. --Replacing phosphorus --Repeat phosphorus level in a.m.  Discharge Diagnoses:  Principal Problem:   Cholestatic hepatitis Active Problems:   Asthma   Hypoglycemia   Hypophosphatemia   Acute hepatitis    Discharge Instructions  Discharge Instructions    Diet -  low sodium heart healthy   Complete by: As directed    Increase activity slowly  Physician Discharge Summary  Stacy Oconnell:[id number] DOB: 1990-10-06 DOA: 09/20/2019  PCP: Patient, No Pcp Per  Admit date: 09/20/2019 Discharge date: 09/21/2019  Admitted From: Home Disposition: Transfer to [facility], accepting physician, Dr. Linward Natal, MD   Discharge Condition: Guarded CODE STATUS: Full code  History of present illness:  Stacy Oconnell a 29 y.o.femalewith medical history significant ofasthma who is coming with complaints of RUQ pain, nausea and multiple episodes of emesis since Sunday (2 days ago). She has been unable to tolerate much orally due to emesis. She is jaundiced. She denies diarrhea, constipation, melena or hematochezia. No flank pain, dysuria, frequency or hematuria. She denies fever, but complains of chills and fatigue. No rhinorrhea,sore throat, wheezing, dyspnea or hemoptysis. Denies chest pain, palpitations, diaphoresis, lower extremity edema. No polyuria, polydipsia, polyphagia or blurred vision.  Initial vital signs temperature 98.5 F, pulse 102, respiration 16, blood pressure 95/67 mmHg and O2 sat 100% on room air. The patient received 2000 mL of NS bolus, ondansetron 4 mg IVP, dextrose 25 g IVP, famotidine 20 mg IVPB, and morphine 4 mg IVP x2 doses.  Urinalysis showed small hemoglobinuria, ketonuria 5 and proteinuria of 100 mg/dL. Microscopic examination was unremarkable. CBC was normal, except for a differential of 90% neutrophils with total WBC of 6.6. Sodium 134, potassium 4.1, chloride 94 and CO2 23 mmol/L. Glucose was 61 mg/dL. Renal function and calcium were normal. Total protein 8.2, albumin 4.3 g/dL. AST was 3570 and ALT 2395 units/L. Total bilirubin was 4.5 and direct bilirubin 2.0 mg/dL. Acetaminophen level was 11 mcg/mL.  CT abdomen/pelvis showed mild fatty change of the liver. Mild pericholecystic edema without evidence of calcified stones.Ultrasound suggested. Please see images  and full radiology report for further detail.  Hospital course:  Acute hepatic failure Patient presenting from home with acute and progressive abdominal pain localized to the right upper quadrant with associated nausea and vomiting over the previous 2 days.  Very poor oral intake over this timeframe and noted skin discoloration.  She has been utilizing some Tylenol, ibuprofen, aspirin during this timeframe.  Patient with recent dental infection/abscess and currently on clindamycin.  Labs concerning with elevated LFTs; AST 3570, ALT 2391 with a total bilirubin of 4.5.  CT abdomen/pelvis notable for fatty changes of the liver with mild pericholecystic edema without calcified gallstones.  Tylenol level 11. Urinalysis negative.  Acute hepatitis panel negative.  ESR 16, CRP 1.4. --Gastroenterology following, Dr. Alease Frame; appreciate assistance --AST [phone] --ALT [phone] --Tbili 4.5>3.6 --INR 3.7 --ANA, smooth muscle antibody, mitochondrial antibody, immunoglobin panel, alpha-1 antitrypsin, ceruloplasmin: Pending --Starting Mucomyst --Continue to monitor LFTs daily  Reached out to [facility] for transfer consideration of acute hepatic failure with concern may need liver transplant given severely progressing hepatic failure.  Case was discussed with hospitalist, Dr. Linward Natal, MD who accepted patient in transfer.  Coagulopathy INR 3.7; likely secondary to liver failure as above.  No signs of active bleeding --Status post vitamin K 10 mg subcutaneously today --Continue monitor INR daily  Asthma Oxygenating well on room air, no shortness of breath. --Bronchodilators as needed  Hypophosphatemia. --Replacing phosphorus --Repeat phosphorus level in a.m.  Discharge Diagnoses:  Principal Problem:   Cholestatic hepatitis Active Problems:   Asthma   Hypoglycemia   Hypophosphatemia   Acute hepatitis    Discharge Instructions  Discharge Instructions    Diet -  low sodium heart healthy   Complete by: As directed    Increase activity slowly

## 2019-09-21 NOTE — Progress Notes (Signed)
Report called to [place], at [facility]

## 2019-09-22 LAB — HCV RNA QUANT: HCV Quantitative: NOT DETECTED IU/mL (ref >50)

## 2019-09-22 LAB — ANTINUCLEAR ANTIBODIES, IFA: ANA Ab, IFA: NEGATIVE

## 2019-09-22 LAB — ANTI-SMOOTH MUSCLE ANTIBODY, IGG: F-Actin IgG: 33 Units — ABNORMAL HIGH (ref 0–19)

## 2019-09-22 LAB — ALPHA-1-ANTITRYPSIN: A-1 Antitrypsin, Ser: 134 mg/dL (ref 100–188)

## 2019-09-22 LAB — MITOCHONDRIAL ANTIBODIES: Mitochondrial M2 Ab, IgG: <20 Units (ref 0.0–20.0)

## 2019-09-22 LAB — CERULOPLASMIN: Ceruloplasmin: 25.7 mg/dL (ref 19.0–39.0)

## 2019-09-25 LAB — IMMUNOGLOBULINS A/E/G/M, SERUM
IgA: 333 mg/dL (ref 87–352)
IgE (Immunoglobulin E), Serum: 600 IU/mL — ABNORMAL HIGH (ref 6–495)
IgG (Immunoglobin G), Serum: 1153 mg/dL (ref 586–1602)
IgM (Immunoglobulin M), Srm: 115 mg/dL (ref 26–217)

## 2019-11-15 ENCOUNTER — Encounter (HOSPITAL_COMMUNITY): Payer: Self-pay

## 2019-11-15 ENCOUNTER — Emergency Department (HOSPITAL_COMMUNITY)
Admission: EM | Admit: 2019-11-15 | Discharge: 2019-11-15 | Disposition: A | Discharge status: Home / Self Care | Payer: BC Managed Care – PPO | Attending: Emergency Medicine | Admitting: Emergency Medicine

## 2019-11-15 ENCOUNTER — Other Ambulatory Visit: Payer: Self-pay

## 2019-11-15 DIAGNOSIS — Z5321 Procedure and treatment not carried out due to patient leaving prior to being seen by health care provider: Secondary | ICD-10-CM | POA: Diagnosis not present

## 2019-11-15 DIAGNOSIS — R200 Anesthesia of skin: Secondary | ICD-10-CM | POA: Insufficient documentation

## 2019-11-15 NOTE — ED Triage Notes (Signed)
Pt says went to bed at 1Am and woke up around 12 noon with numbness to left hand and forearm and unable to move left hand.  Denies injury.  PT says thinks was bit by a spider.  No obvious wound noted.  Skin warm and dry.  Radial pulse present, cap refill wnl.

## 2019-11-15 NOTE — ED Notes (Signed)
PA assessing pt in triage

## 2019-11-22 ENCOUNTER — Encounter: Payer: Self-pay | Admitting: Emergency Medicine

## 2019-11-22 ENCOUNTER — Ambulatory Visit
Admission: EM | Admit: 2019-11-22 | Discharge: 2019-11-22 | Disposition: A | Discharge status: Home / Self Care | Payer: BC Managed Care – PPO | Attending: Emergency Medicine | Admitting: Emergency Medicine

## 2019-11-22 ENCOUNTER — Other Ambulatory Visit: Payer: Self-pay

## 2019-11-22 DIAGNOSIS — R202 Paresthesia of skin: Secondary | ICD-10-CM

## 2019-11-22 DIAGNOSIS — R200 Anesthesia of skin: Secondary | ICD-10-CM

## 2019-11-22 MED ORDER — PREDNISONE 20 MG PO TABS
20.0000 mg | ORAL_TABLET | Freq: Two times a day (BID) | ORAL | 0 refills | Status: AC
Start: 2019-11-22 — End: 2019-11-27

## 2019-11-22 NOTE — ED Provider Notes (Signed)
[facility]   [id number] 11/22/19 Arrival Time: 1803  CC: LT hand PAIN  SUBJECTIVE: History from: patient. Stacy Oconnell is a 29 y.o. female complains of LT hand numbness x 1 week.  Denies a precipitating event or specific injury.  Localizes the pain to the forearm and radiates towards finger.  Denies pain.  Has tried brace with minimal relief.  Symptoms are made worse with activity.  Denies similar symptoms in the past.  Reports associated weakness. Denies fever, chills, erythema, ecchymosis, effusion.  ROS: As per HPI.  All other pertinent ROS negative.     Past Medical History:  Diagnosis Date  . Asthma    Past Surgical History:  Procedure Laterality Date  . APPENDECTOMY    . LAPAROSCOPIC APPENDECTOMY  04/09/2012   Procedure: APPENDECTOMY LAPAROSCOPIC;  Surgeon: Dalia Heading, MD;  Location: [facility];  Service: General;  Laterality: N/A;  . MOUTH SURGERY     Teeth extracted   No Known Allergies No current facility-administered medications on file prior to encounter.   No current outpatient medications on file prior to encounter.   Social History   Socioeconomic History  . Marital status: Single    Spouse name: Not on file  . Number of children: Not on file  . Years of education: Not on file  . Highest education level: Not on file  Occupational History  . Not on file  Tobacco Use  . Smoking status: Current Every Day Smoker    Packs/day: 1.00    Years: 2.00    Pack years: 2.00    Types: Cigarettes  . Smokeless tobacco: Never Used  Substance and Sexual Activity  . Alcohol use: Yes    Comment: occ   . Drug use: Not Currently    Types: Marijuana  . Sexual activity: Yes    Birth control/protection: None  Other Topics Concern  . Not on file  Social History Narrative  . Not on file   Social Determinants of Health   Financial Resource Strain:   . Difficulty of Paying Living Expenses:   Food Insecurity:   . Worried About Programme researcher, broadcasting/film/video in the  Last Year:   . Barista in the Last Year:   Transportation Needs:   . Freight forwarder (Medical):   Marland Kitchen Lack of Transportation (Non-Medical):   Physical Activity:   . Days of Exercise per Week:   . Minutes of Exercise per Session:   Stress:   . Feeling of Stress :   Social Connections:   . Frequency of Communication with Friends and Family:   . Frequency of Social Gatherings with Friends and Family:   . Attends Religious Services:   . Active Member of Clubs or Organizations:   . Attends Banker Meetings:   Marland Kitchen Marital Status:   Intimate Partner Violence:   . Fear of Current or Ex-Partner:   . Emotionally Abused:   Marland Kitchen Physically Abused:   . Sexually Abused:    Family History  Problem Relation Age of Onset  . Gallbladder disease Mother   . Diabetes Mellitus II Father   . Kidney failure Father   . Hypertension Father     OBJECTIVE:  Vitals:   11/22/19 1839  BP: 127/83  Pulse: 99  Resp: 17  Temp: 98.6 F (37 C)  TempSrc: Oral  SpO2: 98%    General appearance: ALERT; in no acute distress.  Head: NCAT Lungs: Normal respiratory effort CV: Radial pulse  2+ Musculoskeletal: LT hand  Inspection: Skin warm, dry, clear and intact without obvious erythema, effusion, or ecchymosis.  Palpation: Nontender to palpation ROM: FROM active and passive Strength: decreased grip strength Negative phalen's Skin: warm and dry Neurologic: Ambulates without difficulty; reports decreased sensation in LT hand Psychological: alert and cooperative; normal mood and affect  ASSESSMENT & PLAN:  1. Numbness and tingling in left hand     Meds ordered this encounter  Medications  . predniSONE (DELTASONE) 20 MG tablet    Sig: Take 1 tablet (20 mg total) by mouth 2 (two) times daily with a meal for 5 days.    Dispense:  10 tablet    Refill:  0    Order Specific Question:   Supervising Provider    Answer:   Eustace Moore [[id number]]   Rest, ice, and  elevate Prednisone prescribed.  Take as directed and to completion Follow up with orthopedist Return or go to the ER if you have any new or worsening symptoms (fever, chills, chest pain, redness, swelling, deformity, etc...)   Reviewed expectations re: course of current medical issues. Questions answered. Outlined signs and symptoms indicating need for more acute intervention. Patient verbalized understanding. After Visit Summary given.    Rennis Harding, PA-C 11/22/19 1848

## 2019-11-22 NOTE — ED Triage Notes (Signed)
See provider note. 

## 2019-11-22 NOTE — Discharge Instructions (Signed)
Rest, ice, and elevate Prednisone prescribed.  Take as directed and to completion Follow up with orthopedist Return or go to the ER if you have any new or worsening symptoms (fever, chills, chest pain, redness, swelling, deformity, etc...)

## 2019-12-13 ENCOUNTER — Ambulatory Visit (INDEPENDENT_AMBULATORY_CARE_PROVIDER_SITE_OTHER): Payer: BC Managed Care – PPO | Admitting: Orthopaedic Surgery

## 2019-12-13 ENCOUNTER — Encounter: Payer: Self-pay | Admitting: Orthopaedic Surgery

## 2019-12-13 ENCOUNTER — Other Ambulatory Visit: Payer: Self-pay

## 2019-12-13 VITALS — BP 130/88 | HR 99 | Ht 65.0 in | Wt 95.0 lb

## 2019-12-13 DIAGNOSIS — G5632 Lesion of radial nerve, left upper limb: Secondary | ICD-10-CM | POA: Diagnosis not present

## 2019-12-13 NOTE — Patient Instructions (Addendum)
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, [place], [place] [address] Phone: [phone]   Steps to Quit Smoking Smoking tobacco is the leading cause of preventable death. It can affect almost every organ in the body. Smoking puts you and people around you at risk for many serious, long-lasting (chronic) diseases. Quitting smoking can be hard, but it is one of the best things that you can do for your health. It is never too late to quit. How do I get ready to quit? When you decide to quit smoking, make a plan to help you succeed. Before you quit:  Pick a date to quit. Set a date within the next 2 weeks to give you time to prepare.  Write down the reasons why you are quitting. Keep this list in places where you will see it often.  Tell your family, friends, and co-workers that you are quitting. Their support is important.  Talk with your doctor about the choices that may help you quit.  Find out if your health insurance will pay for these treatments.  Know the people, places, things, and activities that make you want to smoke (triggers). Avoid them. What first steps can I take to quit smoking?  Throw away all cigarettes at home, at work, and in your car.  Throw away the things that you use when you smoke, such as ashtrays and lighters.  Clean your car. Make sure to empty the ashtray.  Clean your home, including curtains and carpets. What can I do to help me quit smoking? Talk with your doctor about taking medicines and seeing a counselor at the same time. You are more likely to succeed when you do both.  If you are pregnant or breastfeeding, talk with your doctor about counseling or other ways to quit smoking. Do not take medicine to help you quit smoking unless your doctor tells you to do so. To quit smoking: Quit right away  Quit smoking totally, instead of slowly cutting back on how much  you smoke over a period of time.  Go to counseling. You are more likely to quit if you go to counseling sessions regularly. Take medicine You may take medicines to help you quit. Some medicines need a prescription, and some you can buy over-the-counter. Some medicines may contain a drug called nicotine to replace the nicotine in cigarettes. Medicines may:  Help you to stop having the desire to smoke (cravings).  Help to stop the problems that come when you stop smoking (withdrawal symptoms). Your doctor may ask you to use:  Nicotine patches, gum, or lozenges.  Nicotine inhalers or sprays.  Non-nicotine medicine that is taken by mouth. Find resources Find resources and other ways to help you quit smoking and remain smoke-free after you quit. These resources are most helpful when you use them often. They include:  Online chats with a Veterinary surgeon.  Phone quitlines.  Printed Materials engineer.  Support groups or group counseling.  Text messaging programs.  Mobile phone apps. Use apps on your mobile phone or tablet that can help you stick to your quit plan. There are many free apps for mobile phones and tablets as well as websites. Examples include Quit Guide from the [employer] and smokefree.gov  What things can I do to make it easier to quit?   Talk to your family and friends. Ask them to support and encourage you.  Call  a phone quitline (1-800-QUIT-NOW), reach out to support groups, or work with a Veterinary surgeon.  Ask people who smoke to not smoke around you.  Avoid places that make you want to smoke, such as: ? Bars. ? Parties. ? Smoke-break areas at work.  Spend time with people who do not smoke.  Lower the stress in your life. Stress can make you want to smoke. Try these things to help your stress: ? Getting regular exercise. ? Doing deep-breathing exercises. ? Doing yoga. ? Meditating. ? Doing a body scan. To do this, close your eyes, focus on one area of your body at a time  from head to toe. Notice which parts of your body are tense. Try to relax the muscles in those areas. How will I feel when I quit smoking? Day 1 to 3 weeks Within the first 24 hours, you may start to have some problems that come from quitting tobacco. These problems are very bad 2-3 days after you quit, but they do not often last for more than 2-3 weeks. You may get these symptoms:  Mood swings.  Feeling restless, nervous, angry, or annoyed.  Trouble concentrating.  Dizziness.  Strong desire for high-sugar foods and nicotine.  Weight gain.  Trouble pooping (constipation).  Feeling like you may vomit (nausea).  Coughing or a sore throat.  Changes in how the medicines that you take for other issues work in your body.  Depression.  Trouble sleeping (insomnia). Week 3 and afterward After the first 2-3 weeks of quitting, you may start to notice more positive results, such as:  Better sense of smell and taste.  Less coughing and sore throat.  Slower heart rate.  Lower blood pressure.  Clearer skin.  Better breathing.  Fewer sick days. Quitting smoking can be hard. Do not give up if you fail the first time. Some people need to try a few times before they succeed. Do your best to stick to your quit plan, and talk with your doctor if you have any questions or concerns. Summary  Smoking tobacco is the leading cause of preventable death. Quitting smoking can be hard, but it is one of the best things that you can do for your health.  When you decide to quit smoking, make a plan to help you succeed.  Quit smoking right away, not slowly over a period of time.  When you start quitting, seek help from your doctor, family, or friends. This information is not intended to replace advice given to you by your health care provider. Make sure you discuss any questions you have with your health care provider. Document Revised: 12/17/2018 Document Reviewed: 06/12/2018 Elsevier Patient  Education  2020 [employer].

## 2019-12-13 NOTE — Progress Notes (Signed)
Subjective:    Patient ID: Stacy Oconnell, female    DOB: May 23, 1990, 29 y.o.   MRN: [id number]  HPI She has had weakness and pain of the left hand and wrist for two months.  She denies anything that happened two months ago.  She has inability to extend the wrist or fingers.  She was seen at Urgent Care on 11-22-2019.  I have reviewed the notes.  She was given prednisone dose pack which helped some of the numbness but not the motor function.  She bought a cock-up splint and has been using this.  She does not have any trauma, or any problem that happened two months ago.  She is not getting any better. She still works.   Review of Systems  Constitutional: Positive for activity change.  Musculoskeletal: Positive for arthralgias.  All other systems reviewed and are negative.  For Review of Systems, all other systems reviewed and are negative.  The following is a summary of the past history medically, past history surgically, known current medicines, social history and family history.  This information is gathered electronically by the computer from prior information and documentation.  I review this each visit and have found including this information at this point in the chart is beneficial and informative.   Past Medical History:  Diagnosis Date  . Asthma     Past Surgical History:  Procedure Laterality Date  . APPENDECTOMY    . LAPAROSCOPIC APPENDECTOMY  04/09/2012   Procedure: APPENDECTOMY LAPAROSCOPIC;  Surgeon: Dalia Heading, MD;  Location: [facility];  Service: General;  Laterality: N/A;  . MOUTH SURGERY     Teeth extracted    No current outpatient medications on file prior to visit.   No current facility-administered medications on file prior to visit.    Social History   Socioeconomic History  . Marital status: Single    Spouse name: Not on file  . Number of children: Not on file  . Years of education: Not on file  . Highest education level: Not on file  Occupational  History  . Not on file  Tobacco Use  . Smoking status: Current Every Day Smoker    Packs/day: 1.00    Years: 2.00    Pack years: 2.00    Types: Cigarettes  . Smokeless tobacco: Never Used  Substance and Sexual Activity  . Alcohol use: Yes    Comment: occ   . Drug use: Not Currently    Types: Marijuana  . Sexual activity: Yes    Birth control/protection: None  Other Topics Concern  . Not on file  Social History Narrative  . Not on file   Social Determinants of Health   Financial Resource Strain:   . Difficulty of Paying Living Expenses: Not on file  Food Insecurity:   . Worried About Programme researcher, broadcasting/film/video in the Last Year: Not on file  . Ran Out of Food in the Last Year: Not on file  Transportation Needs:   . Lack of Transportation (Medical): Not on file  . Lack of Transportation (Non-Medical): Not on file  Physical Activity:   . Days of Exercise per Week: Not on file  . Minutes of Exercise per Session: Not on file  Stress:   . Feeling of Stress : Not on file  Social Connections:   . Frequency of Communication with Friends and Family: Not on file  . Frequency of Social Gatherings with Friends and Family: Not on file  .  Attends Religious Services: Not on file  . Active Member of Clubs or Organizations: Not on file  . Attends Banker Meetings: Not on file  . Marital Status: Not on file  Intimate Partner Violence:   . Fear of Current or Ex-Partner: Not on file  . Emotionally Abused: Not on file  . Physically Abused: Not on file  . Sexually Abused: Not on file    Family History  Problem Relation Age of Onset  . Gallbladder disease Mother   . Diabetes Mellitus II Father   . Kidney failure Father   . Hypertension Father     BP 130/88   Pulse 99   Ht 5\' 5"  (1.651 m)   Wt 95 lb (43.1 kg)   BMI 15.81 kg/m   Body mass index is 15.81 kg/m.      Objective:   Physical Exam Vitals and nursing note reviewed.  Constitutional:      Appearance: She is  well-developed.  HENT:     Head: Normocephalic and atraumatic.  Eyes:     Conjunctiva/sclera: Conjunctivae normal.     Pupils: Pupils are equal, round, and reactive to light.  Cardiovascular:     Rate and Rhythm: Normal rate and regular rhythm.  Pulmonary:     Effort: Pulmonary effort is normal.  Abdominal:     Palpations: Abdomen is soft.  Musculoskeletal:       Hands:     Cervical back: Normal range of motion and neck supple.  Skin:    General: Skin is warm and dry.  Neurological:     Mental Status: She is alert and oriented to person, place, and time.     Cranial Nerves: No cranial nerve deficit.     Motor: No abnormal muscle tone.     Coordination: Coordination normal.     Deep Tendon Reflexes: Reflexes are normal and symmetric. Reflexes normal.  Psychiatric:        Behavior: Behavior normal.        Thought Content: Thought content normal.        Judgment: Judgment normal.           Assessment & Plan:   Encounter Diagnosis  Name Primary?  . Radial nerve palsy, left Yes   I have explained the findings to her.  She will need EMGs.  I have given better cock-up splint.  We need to find the area of injury to the radial nerve.  She may need hand surgery or neurosurgery referral.  Return in two weeks.  Call if any problem.  Precautions discussed.   Electronically Signed , MD 9/7/202111:14 AM

## 2020-01-05 ENCOUNTER — Ambulatory Visit: Payer: BC Managed Care – PPO | Admitting: Orthopaedic Surgery

## 2020-01-24 ENCOUNTER — Telehealth: Payer: Self-pay | Admitting: Orthopaedic Surgery

## 2020-01-24 NOTE — Telephone Encounter (Signed)
Call received from [facility], per [place] (#[phone]) - as noted in referral workqueue, patient no showed (2 times) for appointments; therefore, their office will not call her to reschedule. Follow up appointment has also been cancelled. Unable to reach patient by phone (no voice mail) - letter sent.

## 2020-01-31 ENCOUNTER — Ambulatory Visit: Payer: BC Managed Care – PPO | Admitting: Orthopaedic Surgery

## 2020-04-10 ENCOUNTER — Other Ambulatory Visit: Payer: Self-pay

## 2020-04-10 DIAGNOSIS — U071 COVID-19: Secondary | ICD-10-CM | POA: Insufficient documentation

## 2020-04-10 DIAGNOSIS — R509 Fever, unspecified: Secondary | ICD-10-CM | POA: Diagnosis present

## 2020-04-10 DIAGNOSIS — F17210 Nicotine dependence, cigarettes, uncomplicated: Secondary | ICD-10-CM | POA: Insufficient documentation

## 2020-04-10 DIAGNOSIS — J45909 Unspecified asthma, uncomplicated: Secondary | ICD-10-CM | POA: Insufficient documentation

## 2020-04-10 NOTE — ED Triage Notes (Addendum)
Body aches, cough, headache since yesterday.  Fever in triage. When offered tyl or ibuprofen she declined both.

## 2020-04-11 ENCOUNTER — Emergency Department (HOSPITAL_COMMUNITY)
Admission: EM | Admit: 2020-04-11 | Discharge: 2020-04-11 | Disposition: A | Discharge status: Home / Self Care | Payer: BC Managed Care – PPO | Attending: Emergency Medicine | Admitting: Emergency Medicine

## 2020-04-11 ENCOUNTER — Emergency Department (HOSPITAL_COMMUNITY): Payer: BC Managed Care – PPO

## 2020-04-11 DIAGNOSIS — Z20822 Contact with and (suspected) exposure to covid-19: Secondary | ICD-10-CM

## 2020-04-11 LAB — SARS CORONAVIRUS 2 BY RT PCR (HOSPITAL ORDER, PERFORMED IN ~~LOC~~ HOSPITAL LAB): SARS Coronavirus 2: POSITIVE — AB

## 2020-04-11 MED ORDER — AZITHROMYCIN 250 MG PO TABS: 250.0000 mg | ORAL_TABLET | Freq: Every day | ORAL | 0 refills | Status: AC

## 2020-04-11 MED ORDER — PREDNISONE 20 MG PO TABS: ORAL_TABLET | ORAL | 0 refills | Status: AC

## 2020-04-11 NOTE — Discharge Instructions (Signed)
Drink plenty of fluids.  Take acetaminophen 650 mg plus ibuprofen 400 mg every 6 hours as needed for fever and body aches.  Take Mucinex DM over-the-counter for cough.  Check your Covid test later today, if it is positive start taking zinc 50 mg once a day, vitamin C and D, and aspirin 81 mg a day.You need to be reevaluated if your covid test is + and you get short of breath or struggle to breathe.

## 2020-04-11 NOTE — ED Notes (Signed)
Pt rec'd and understood d/c instructions.

## 2020-04-11 NOTE — ED Provider Notes (Signed)
[facility] EMERGENCY DEPARTMENT Provider Note   CSN: [id number] Arrival date & time: 04/10/20  1830   Time seen 6:07 AM  History Chief Complaint  Patient presents with  . Generalized Body Aches    Stacy Oconnell is a 30 y.o. female.  HPI Patient reports she started feeling bad on January 3.  She has a cough with clear mucus production, clear rhinorrhea, sore throat when she coughs.  She was unaware of having fever but she did have some chills yesterday, January 4.  She denies nausea, vomiting, diarrhea, chest pain, or shortness of breath.  She is unaware of being exposed to anybody with Covid.  She works in a Naval architect.  Patient has not taken the Covid vaccine  PCP Patient, No Pcp Per     Past Medical History:  Diagnosis Date  . Asthma     Patient Active Problem List   Diagnosis Date Noted  . Hypophosphatemia 09/21/2019  . Acute hepatitis 09/21/2019  . 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 Asthma 09/20/2019  . Hypoglycemia 09/20/2019    Past Surgical History:  Procedure Laterality Date  . APPENDECTOMY    . LAPAROSCOPIC APPENDECTOMY  04/09/2012   Procedure: APPENDECTOMY LAPAROSCOPIC;  Surgeon: Dalia Heading, MD;  Location: [facility];  Service: General;  Laterality: N/A;  . MOUTH SURGERY     Teeth extracted     OB History   No obstetric history on file.     Family History  Problem Relation Age of Onset  . Gallbladder disease Mother   . Diabetes Mellitus II Father   . Kidney failure Father   . Hypertension Father     Social History   Tobacco Use  . Smoking status: Current Every Day Smoker    Packs/day: 1.00    Years: 2.00    Pack years: 2.00    Types: Cigarettes  . Smokeless tobacco: Never Used  Substance Use Topics  . Alcohol use: Yes    Comment: occ   . Drug use: Not Currently    Types: Marijuana  employed   Home Medications Prior to Admission medications   Medication Sig Start Date End Date Taking? Authorizing Provider  azithromycin  (ZITHROMAX) 250 MG tablet Take 1 tablet (250 mg total) by mouth daily. Take first 2 tablets together, then 1 every day until finished. 04/11/20  Yes Devoria Albe, MD  predniSONE (DELTASONE) 20 MG tablet Take 3 po QD x 3d , then 2 po QD x 3d then 1 po QD x 3d 04/11/20  Yes Devoria Albe, MD  NONE  Allergies    Clindamycin  Review of Systems   Review of Systems  All other systems reviewed and are negative.   Physical Exam Updated Vital Signs BP 121/80 (BP Location: Right Arm)   Pulse 93   Temp (!) 102.8 F (39.3 C) (Oral)   Resp 20   Ht 5\' 5"  (1.651 m)   Wt 45.4 kg   LMP 04/02/2020   SpO2 99%   BMI 16.64 kg/m   Physical Exam Vitals and nursing note reviewed.  Constitutional:      General: She is not in acute distress.    Appearance: Normal appearance. She is normal weight. She is not ill-appearing.  HENT:     Head: Normocephalic and atraumatic.     Right Ear: External ear normal.     Left Ear: External ear normal.     Nose: Nose normal.     Mouth/Throat:     Mouth: Mucous  membranes are moist.     Pharynx: No oropharyngeal exudate or posterior oropharyngeal erythema.  Eyes:     Extraocular Movements: Extraocular movements intact.     Conjunctiva/sclera: Conjunctivae normal.     Pupils: Pupils are equal, round, and reactive to light.  Cardiovascular:     Rate and Rhythm: Normal rate and regular rhythm.     Pulses: Normal pulses.     Heart sounds: Normal heart sounds.  Pulmonary:     Effort: Pulmonary effort is normal. No respiratory distress.     Breath sounds: Normal breath sounds. No stridor. No wheezing, rhonchi or rales.     Comments: Has some cough during exam Musculoskeletal:        General: Normal range of motion.     Cervical back: Normal range of motion.  Skin:    General: Skin is warm and dry.  Neurological:     General: No focal deficit present.     Mental Status: She is alert and oriented to person, place, and time.  Psychiatric:        Mood and Affect: Mood  normal.        Behavior: Behavior normal.        Thought Content: Thought content normal.     ED Results / Procedures / Treatments   Labs (all labs ordered are listed, but only abnormal results are displayed) Labs Reviewed  SARS CORONAVIRUS 2 (TAT 6-24 HRS)    EKG None  Radiology DG Chest Port 1 View  Result Date: 04/11/2020 CLINICAL DATA:  Fever.  Cough. EXAM: PORTABLE CHEST 1 VIEW COMPARISON:  No prior. FINDINGS: Mediastinum and hilar structures normal. Heart size normal. No focal infiltrate. No pleural effusion or pneumothorax. Mild thoracic spine scoliosis. IMPRESSION: No acute cardiopulmonary disease. Electronically Signed   By: Maisie Fus  Register   On: 04/11/2020 06:26    Procedures Procedures (including critical care time)  Medications Ordered in ED Medications - No data to display  ED Course  I have reviewed the triage vital signs and the nursing notes.  Pertinent labs & imaging results that were available during my care of the patient were reviewed by me and considered in my medical decision making (see chart for details).    MDM Rules/Calculators/A&P                          I saw her chest x-ray while still on the portable machine.  I did not see any obvious infiltrates.  Patient most likely has Covid illness.  We are seeing multiple patients in the ED tonight with similar.  With her fever she was discharged home with Zithromax and prednisone.  She was Covid tested in the ED.  Unfortunately she has no risk factors to make her a candidate to receive monoclonal antibiotics which are on short supply at this time.   Final Clinical Impression(s) / ED Diagnoses Final diagnoses:  Suspected COVID-19 virus infection    Rx / DC Orders ED Discharge Orders         Ordered    azithromycin (ZITHROMAX) 250 MG tablet  Daily        04/11/20 0644    predniSONE (DELTASONE) 20 MG tablet        04/11/20 0644        OTC ibuprofen and acetaminophen  Plan discharge  Devoria Albe, MD, Concha Pyo, MD 04/11/20 [phone]

## 2020-11-11 ENCOUNTER — Emergency Department (HOSPITAL_COMMUNITY)
Admission: EM | Admit: 2020-11-11 | Discharge: 2020-11-11 | Disposition: A | Discharge status: Home / Self Care | Payer: BC Managed Care – PPO | Attending: Emergency Medicine | Admitting: Emergency Medicine

## 2020-11-11 ENCOUNTER — Encounter (HOSPITAL_COMMUNITY): Payer: Self-pay | Admitting: Emergency Medicine

## 2020-11-11 ENCOUNTER — Other Ambulatory Visit: Payer: Self-pay

## 2020-11-11 DIAGNOSIS — F17210 Nicotine dependence, cigarettes, uncomplicated: Secondary | ICD-10-CM | POA: Insufficient documentation

## 2020-11-11 DIAGNOSIS — U071 COVID-19: Secondary | ICD-10-CM | POA: Insufficient documentation

## 2020-11-11 DIAGNOSIS — M791 Myalgia, unspecified site: Secondary | ICD-10-CM | POA: Diagnosis present

## 2020-11-11 DIAGNOSIS — J45909 Unspecified asthma, uncomplicated: Secondary | ICD-10-CM | POA: Insufficient documentation

## 2020-11-11 MED ORDER — BENZONATATE 200 MG PO CAPS: 200.0000 mg | ORAL_CAPSULE | Freq: Three times a day (TID) | ORAL | 0 refills | Status: AC | PRN

## 2020-11-11 MED ORDER — IBUPROFEN 800 MG PO TABS: 800.0000 mg | ORAL_TABLET | Freq: Four times a day (QID) | ORAL | 0 refills | Status: AC | PRN

## 2020-11-11 NOTE — ED Triage Notes (Signed)
Pt reports positive home covid test and "just wanted to make sure." Pt c/o generalized bodyaches and weakness

## 2020-11-11 NOTE — ED Provider Notes (Signed)
[facility] EMERGENCY DEPARTMENT Provider Note   CSN: [id number] Arrival date & time: 11/11/20  1919     History No chief complaint on file.   Stacy Oconnell is a 30 y.o. female.  Patient presents to the emergency department for evaluation of generalized malaise, body aches and feeling weak.  One of her friends recently had COVID.  She took a home test and it was positive.      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     Family History  Problem Relation Age of Onset   Gallbladder disease Mother    Diabetes Mellitus II Father    Kidney failure Father    Hypertension Father     Social History   Tobacco Use   Smoking status: Every Day    Packs/day: 1.00    Years: 2.00    Pack years: 2.00    Types: Cigarettes   Smokeless tobacco: Never  Substance Use Topics   Alcohol use: Yes    Comment: occ    Drug use: Not Currently    Types: Marijuana    Home Medications Prior to Admission medications   Medication Sig Start Date End Date Taking? Authorizing Provider  benzonatate (TESSALON) 200 MG capsule Take 1 capsule (200 mg total) by mouth 3 (three) times daily as needed for cough. 11/11/20  Yes Dwayna Kentner, Canary Brim, MD  ibuprofen (ADVIL) 800 MG tablet Take 1 tablet (800 mg total) by mouth every 6 (six) hours as needed for moderate pain. 11/11/20  Yes Cortez Steelman, Canary Brim, MD  azithromycin (ZITHROMAX) 250 MG tablet Take 1 tablet (250 mg total) by  mouth daily. Take first 2 tablets together, then 1 every day until finished. 04/11/20   Devoria Albe, MD  predniSONE (DELTASONE) 20 MG tablet Take 3 po QD x 3d , then 2 po QD x 3d then 1 po QD x 3d 04/11/20   Devoria Albe, MD    Allergies    Clindamycin  Review of Systems   Review of Systems  Constitutional:  Positive for chills and fever.  Musculoskeletal:  Positive for myalgias.  All other systems reviewed and are negative.  Physical Exam Updated Vital Signs BP 107/81   Pulse (!) 102   Temp 100 F (37.8 C) (Oral)   Resp 19   Ht 5\' 5"  (1.651 m)   Wt 45.4 kg   SpO2 99%   BMI 16.64 kg/m   Physical Exam Vitals and nursing note reviewed.  Constitutional:      General: She is not in acute distress.    Appearance: Normal appearance. She is well-developed.  HENT:     Head: Normocephalic and atraumatic.     Right Ear: Hearing normal.     Left Ear: Hearing normal.     Nose: Nose normal.  Eyes:     Conjunctiva/sclera: Conjunctivae normal.     Pupils: Pupils are equal, round,  and reactive to light.  Cardiovascular:     Rate and Rhythm: Regular rhythm.     Heart sounds: S1 normal and S2 normal. No murmur heard.   No friction rub. No gallop.  Pulmonary:     Effort: Pulmonary effort is normal. No respiratory distress.     Breath sounds: Normal breath sounds.  Chest:     Chest wall: No tenderness.  Abdominal:     General: Bowel sounds are normal.     Palpations: Abdomen is soft.     Tenderness: There is no abdominal tenderness. There is no guarding or rebound. Negative signs include Murphy's sign and McBurney's sign.     Hernia: No hernia is present.  Musculoskeletal:        General: Normal range of motion.     Cervical back: Normal range of motion and neck supple.  Skin:    General: Skin is warm and dry.     Findings: No rash.  Neurological:     Mental Status: She is alert and oriented to person, place, and time.     GCS: GCS eye subscore is 4. GCS verbal subscore is 5. GCS  motor subscore is 6.     Cranial Nerves: No cranial nerve deficit.     Sensory: No sensory deficit.     Coordination: Coordination normal.  Psychiatric:        Speech: Speech normal.        Behavior: Behavior normal.        Thought Content: Thought content normal.    ED Results / Procedures / Treatments   Labs (all labs ordered are listed, but only abnormal results are displayed) Labs Reviewed - No data to display  EKG None  Radiology No results found.  Procedures Procedures   Medications Ordered in ED Medications - No data to display  ED Course  I have reviewed the triage vital signs and the nursing notes.  Pertinent labs & imaging results that were available during my care of the patient were reviewed by me and considered in my medical decision making (see chart for details).    MDM Rules/Calculators/A&P                           Patient with COVID symptoms.  She had a home positive test.  This is an accurate test, treat symptomatically.  She is not in any high risk groups requiring further treatment.  Final Clinical Impression(s) / ED Diagnoses Final diagnoses:  COVID-19    Rx / DC Orders ED Discharge Orders          Ordered    benzonatate (TESSALON) 200 MG capsule  3 times daily PRN        11/11/20 2307    ibuprofen (ADVIL) 800 MG tablet  Every 6 hours PRN        11/11/20 2307             Gilda Crease, MD 11/11/20 2308

## 2020-12-31 IMAGING — CT CT ABD-PELV W/ CM
2 of 4 series · 15 of 46 positions shown, 17 images · IV contrast (Omnipaque or Isovue)
Comparison: 04/07/2012

CLINICAL DATA: Nausea and vomiting with abnormal liver function
tests. Central abdominal pain.

EXAM:
CT ABDOMEN AND PELVIS WITH CONTRAST
TECHNIQUE: Multidetector CT imaging of the abdomen and pelvis was performed
using the standard protocol following bolus administration of
intravenous contrast.
CONTRAST:  75mL OMNIPAQUE IOHEXOL 300 MG/ML  SOLN

[Series 2: axial st · axial · 0.53mm/px · z∈[-424,-54]mm · 12 of 88 slices shown, 14 images]
[im 7/88  soft-tissue]
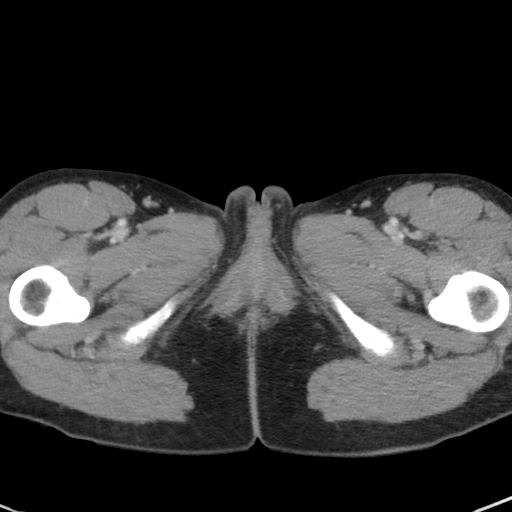
[im 7/88  bone]
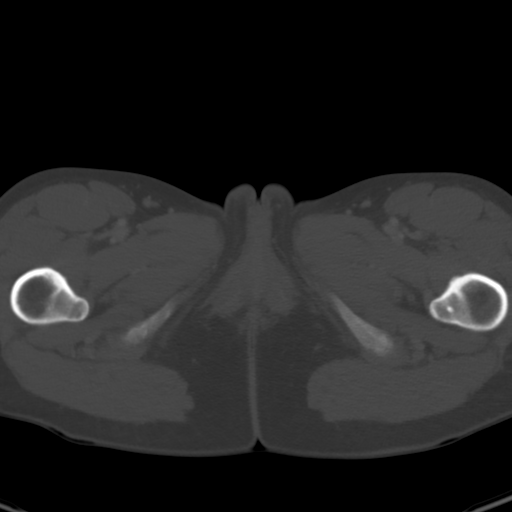
[im 14/88  soft-tissue]
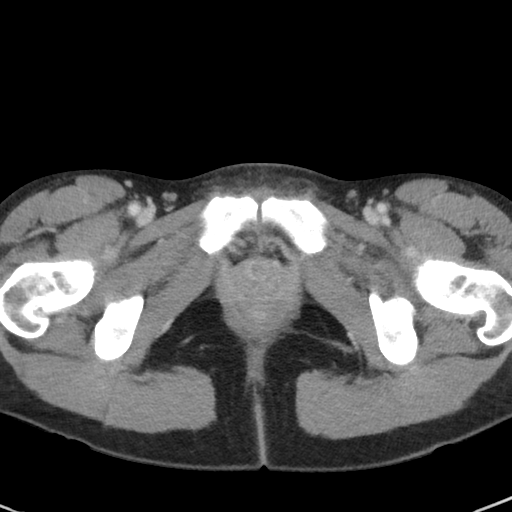
[im 21/88  soft-tissue]
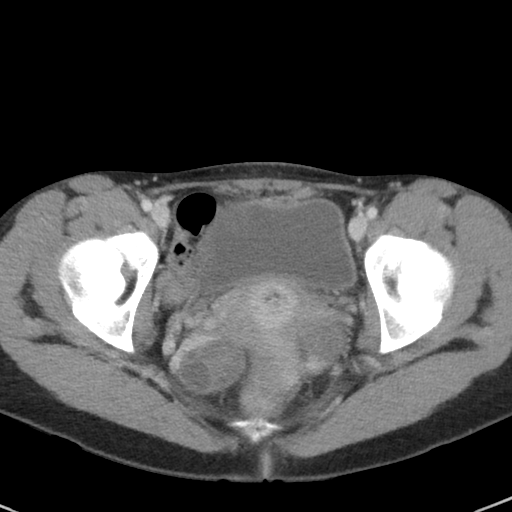
[im 27/88  soft-tissue]
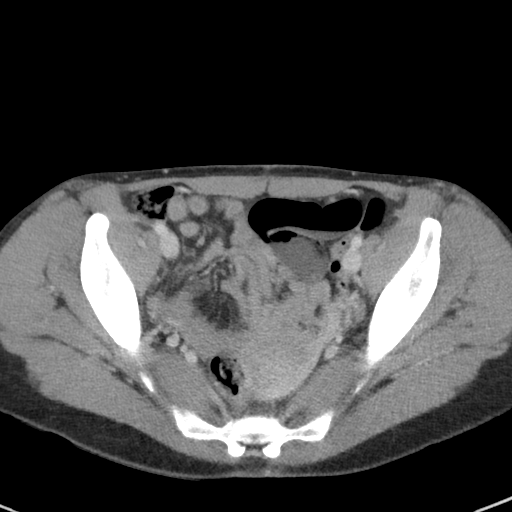
[im 34/88  soft-tissue]
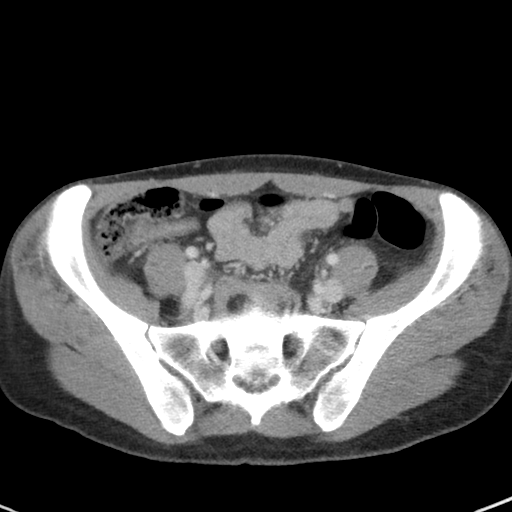
[im 41/88  soft-tissue]
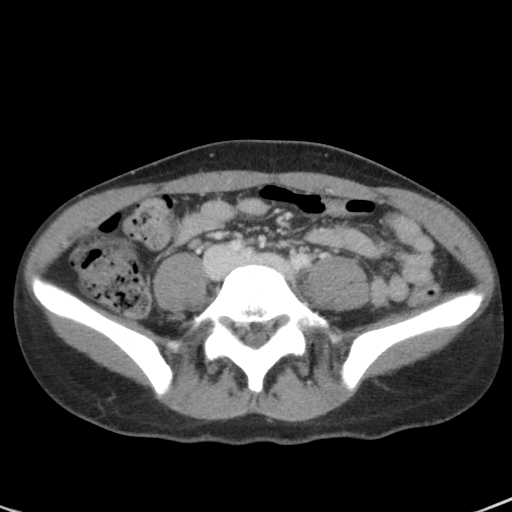
[im 47/88  soft-tissue]
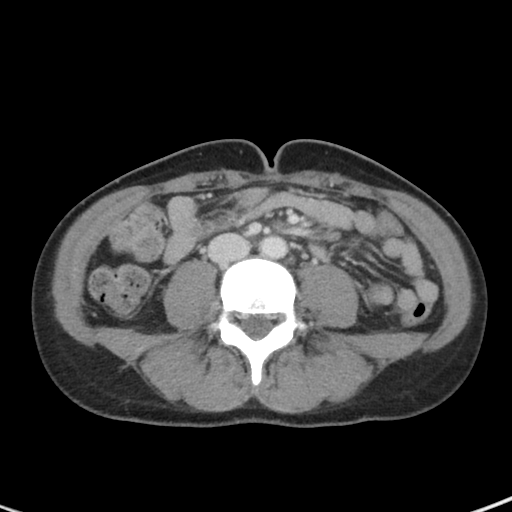
[im 54/88  soft-tissue]
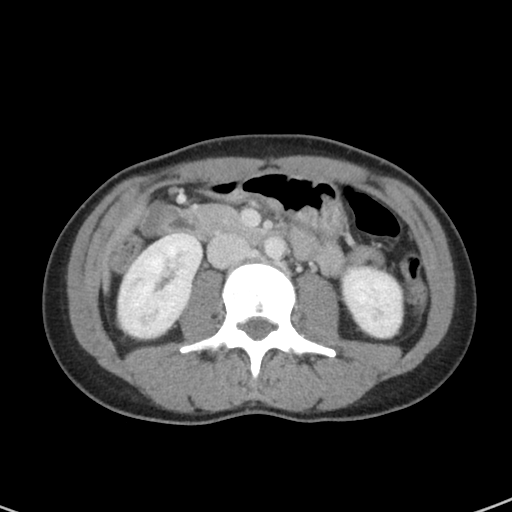
[im 61/88  soft-tissue]
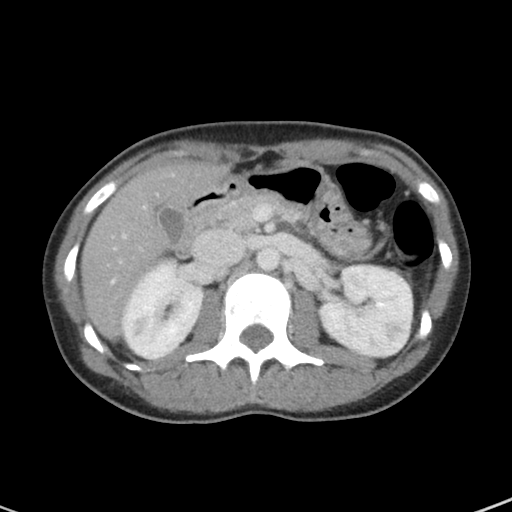
[im 61/88  bone]
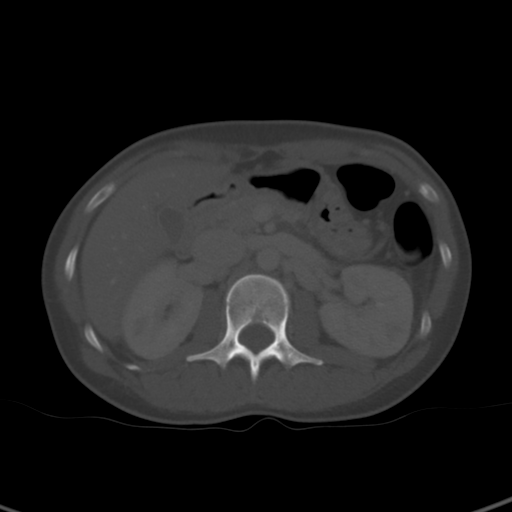
[im 67/88  soft-tissue]
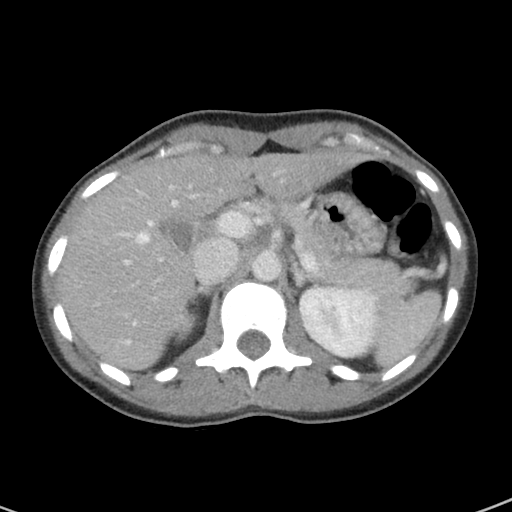
[im 74/88  soft-tissue]
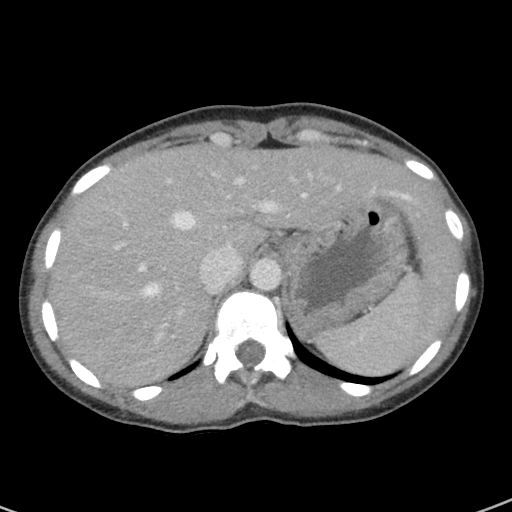
[im 81/88  soft-tissue]
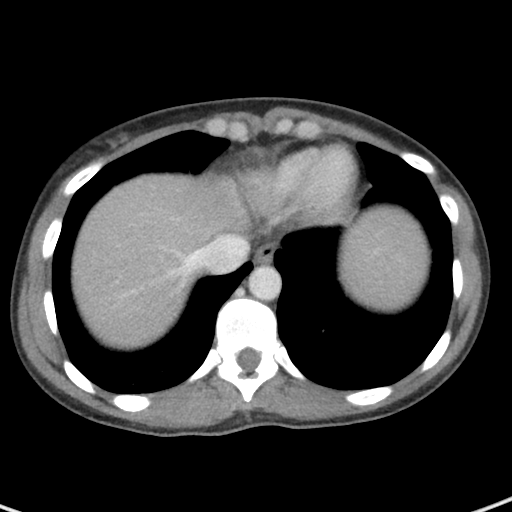

[Series 5: coronal st · coronal · 0.55mm/px · 3 of 106 slices shown]
[im 36/106  soft-tissue]
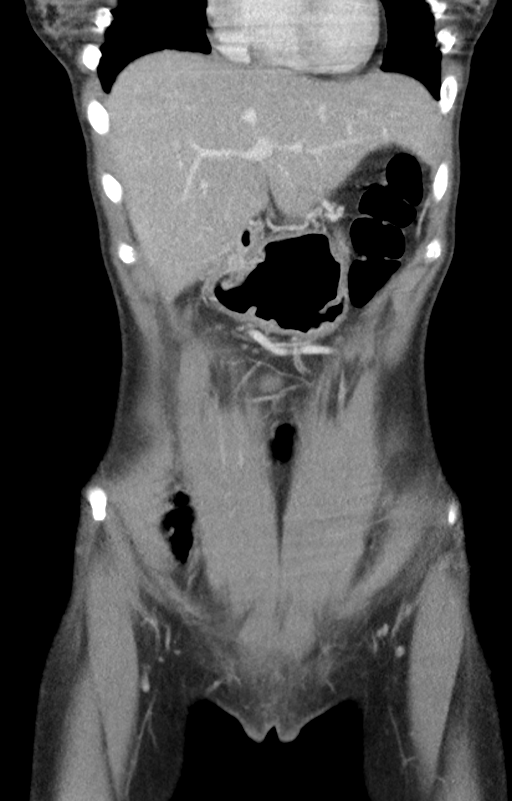
[im 47/106  soft-tissue]
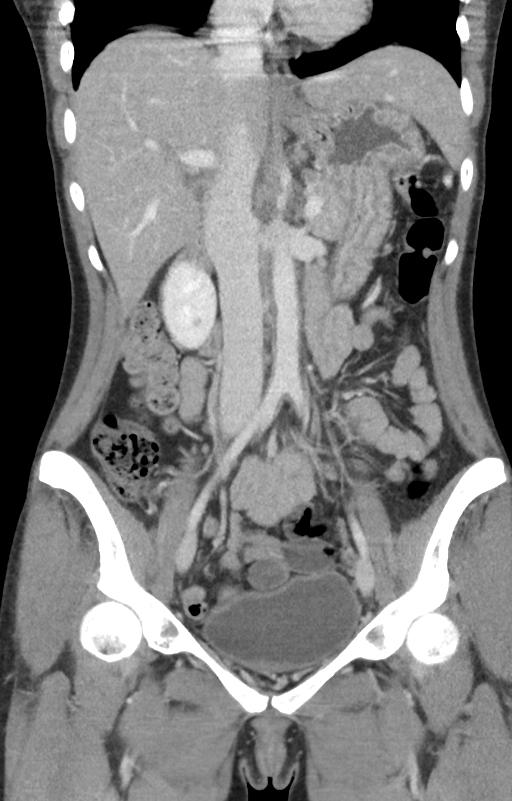
[im 59/106  soft-tissue]
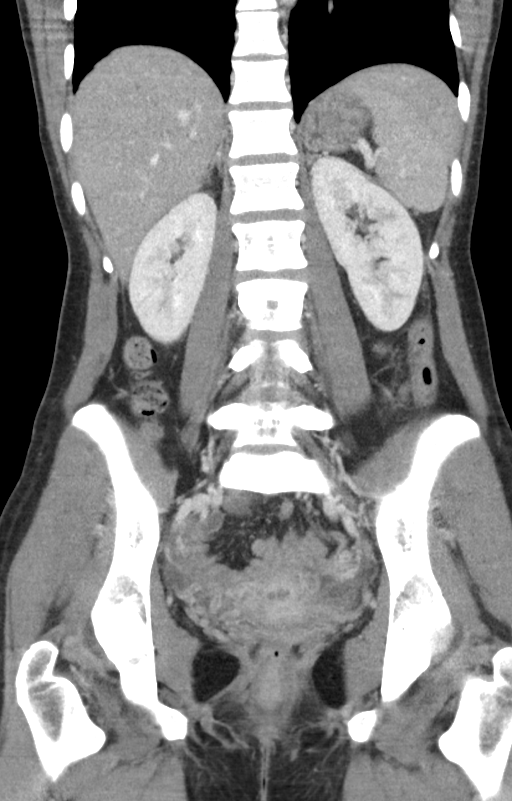

[15 of 46 positions shown; findings below may reference images not displayed]

FINDINGS: Lower chest: Normal

Hepatobiliary: Mild diffuse fatty change of the liver. Mild
pericholecystic edema. No focal liver lesion. No calcified
gallstones.

Pancreas: Normal

Spleen: Normal

Adrenals/Urinary Tract: Adrenal glands are normal. Kidneys are
normal. Bladder is normal.

Stomach/Bowel: No abnormal bowel finding. No ileus or obstruction.
No inflammatory pathology evident. Previous appendectomy.

Vascular/Lymphatic: Normal

Reproductive: Retroverted uterus. Bilateral adrenal cysts, likely
functional in a person of this age. No visible pelvic inflammatory
change.

Other: No free fluid or air.

Musculoskeletal: Normal
IMPRESSION: Mild fatty change of the liver. Mild pericholecystic edema without
evidence of calcified gallstones. Consider right upper quadrant
ultrasound if there is concern about potential cholecystitis. CT
finding is not highly suspicious. No other abnormal finding.

## 2021-07-23 IMAGING — DX DG CHEST 1V PORT
1 series · 1 of 1 positions shown · non-contrast
Comparison: No prior.

CLINICAL DATA: Fever.  Cough.

EXAM:
PORTABLE CHEST 1 VIEW

[chest ap]
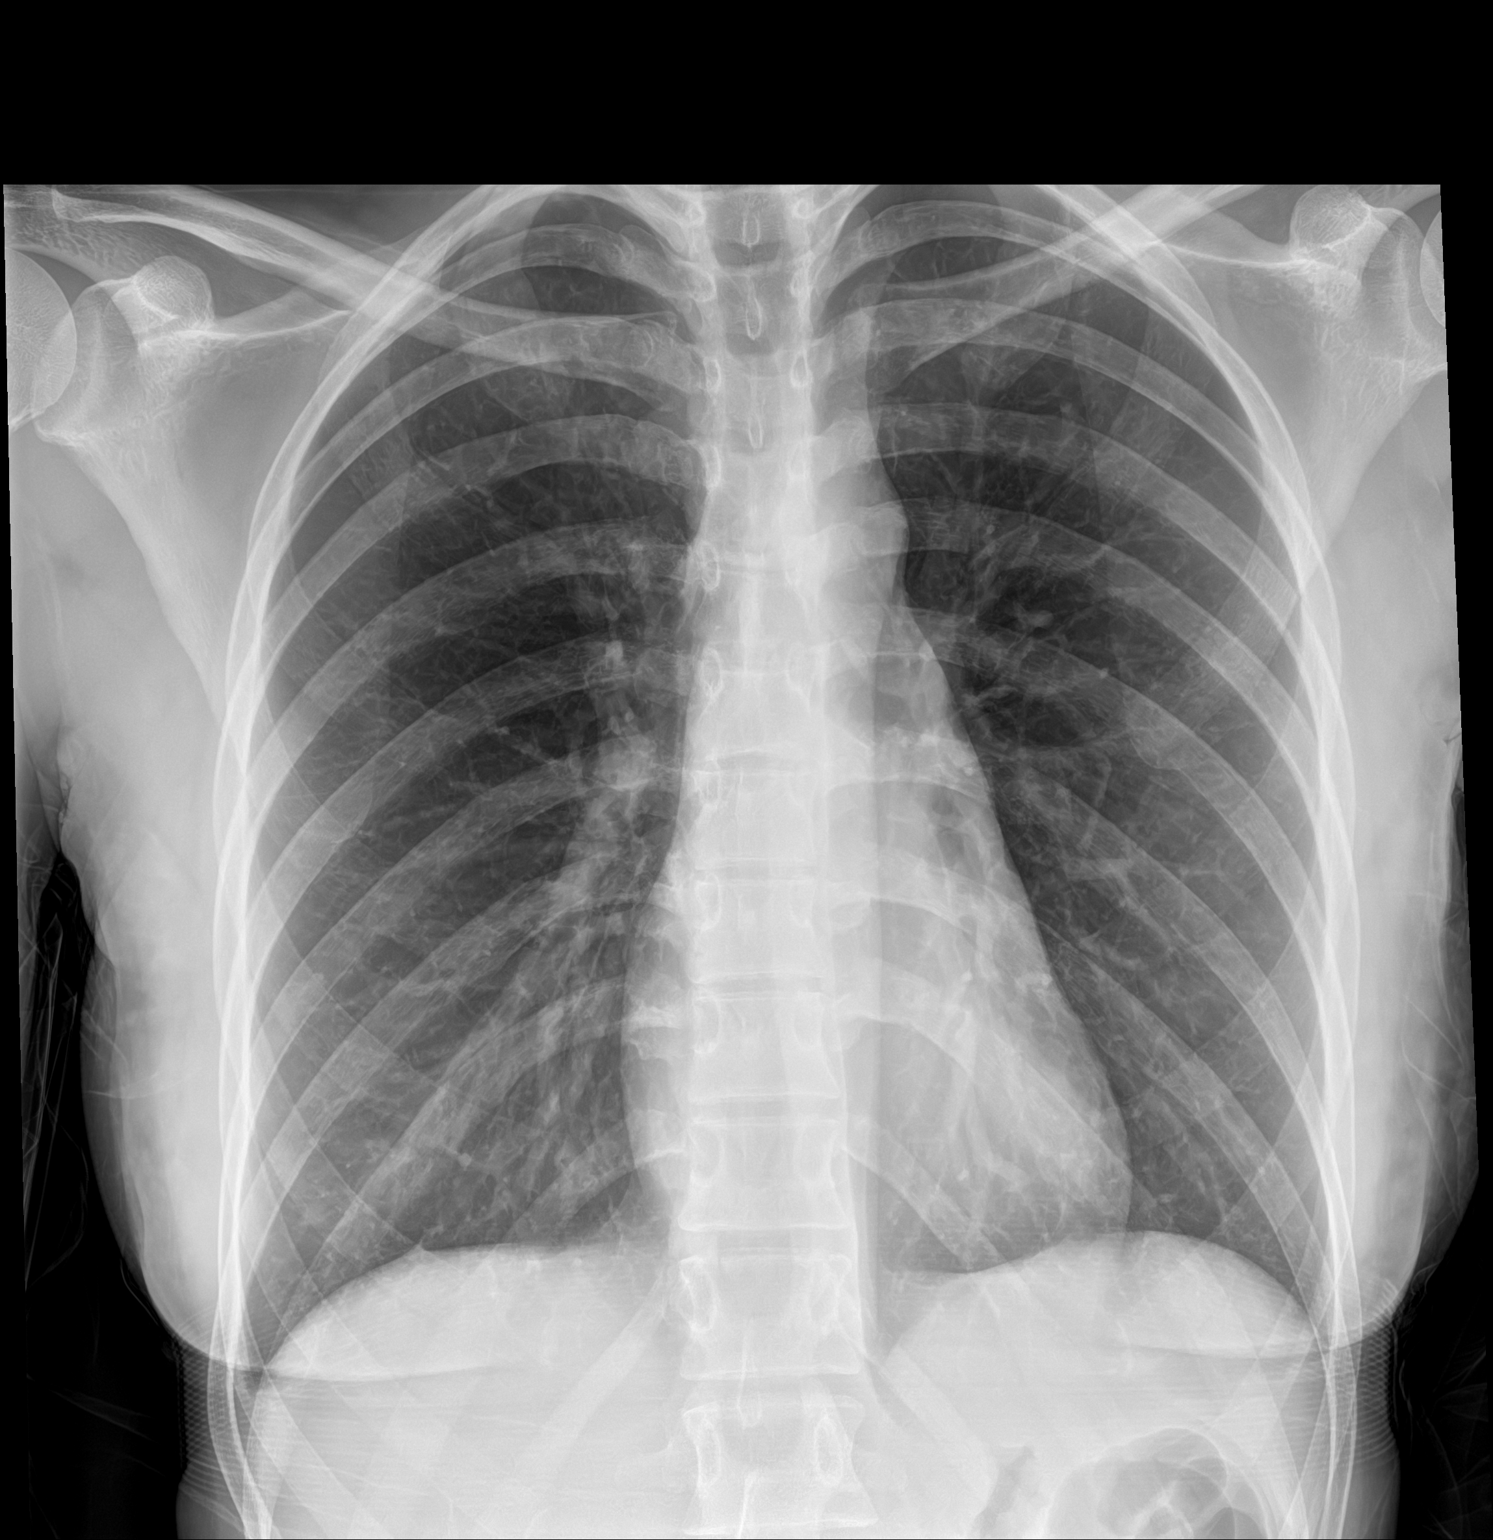

[1 of 1 positions shown; findings below may reference images not displayed]

FINDINGS: Mediastinum and hilar structures normal. Heart size normal. No focal
infiltrate. No pleural effusion or pneumothorax. Mild thoracic spine
scoliosis.
IMPRESSION: No acute cardiopulmonary disease.

## 2021-11-18 ENCOUNTER — Ambulatory Visit
Admission: EM | Admit: 2021-11-18 | Discharge: 2021-11-18 | Disposition: A | Discharge status: Home / Self Care | Payer: Managed Care, Other (non HMO)

## 2021-11-18 DIAGNOSIS — M67432 Ganglion, left wrist: Secondary | ICD-10-CM

## 2021-11-18 NOTE — ED Triage Notes (Signed)
Pt presents with complaints of constantly feeling like her left wrist needs to be popped x 2-3 weeks. Denies injury but uses her hands a lot at work.

## 2021-11-18 NOTE — Discharge Instructions (Signed)
Your symptoms are consistent with a ganglion cyst. May take ibuprofen or Tylenol as needed for pain or discomfort. Recommend using the wrist brace to add compression and support of the left wrist while symptoms persist. As discussed, follow-up with orthopedics for further evaluation.  You can follow-up with [facility] [place] at [phone] or [facility] at [phone].

## 2021-11-18 NOTE — ED Provider Notes (Signed)
[facility]    CSN: [id number] Arrival date & time: 11/18/21  1925      History   Chief Complaint Chief Complaint  Patient presents with   Hand Problem    HPI Stacy Oconnell is a 31 y.o. female.   The history is provided by the patient.   Patient presents for complaints of swelling of the left wrist has been present for the past 2 to 3 weeks.  Patient states that at her job she does a lot of repetitive motion, denies known injury or trauma of the left wrist..  She states that when she bends her wrist downward, she sees a protrusion on the top of the left wrist.  She states that she has intermittent numbness and tingling of the left hand, which has been persistent for quite some time.  She states that she did undergo physical therapy for that symptom.  She denies pain, wrist swelling, radiation of pain, or decreased grip strength.  Patient has not tried any medication for her symptoms at this time.  Patient reports that she has used a wrist brace in the past.  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      Home Medications    Prior to Admission medications   Medication Sig Start Date End Date Taking? Authorizing Provider  azithromycin (ZITHROMAX) 250 MG tablet Take 1 tablet (250 mg total) by mouth daily. Take first 2 tablets together, then 1 every day until finished. 04/11/20   Devoria Albe, MD  benzonatate (TESSALON) 200 MG capsule Take 1  capsule (200 mg total) by mouth 3 (three) times daily as needed for cough. 11/11/20   Gilda Crease, MD  ibuprofen (ADVIL) 800 MG tablet Take 1 tablet (800 mg total) by mouth every 6 (six) hours as needed for moderate pain. 11/11/20   Gilda Crease, MD  predniSONE (DELTASONE) 20 MG tablet Take 3 po QD x 3d , then 2 po QD x 3d then 1 po QD x 3d 04/11/20   Devoria Albe, MD    Family History Family History  Problem Relation Age of Onset   Gallbladder disease Mother    Diabetes Mellitus II Father    Kidney failure Father    Hypertension Father     Social History Social History   Tobacco Use   Smoking status: Every Day    Packs/day: 1.00    Years: 2.00    Total pack years: 2.00    Types: Cigarettes   Smokeless tobacco: Never  Substance Use Topics   Alcohol use: Yes    Comment: occ    Drug use: Not Currently    Types: Marijuana     Allergies   Clindamycin   Review of Systems Review of Systems Per HPI  Physical Exam Triage Vital Signs ED Triage Vitals  Enc Vitals Group     BP 11/18/21 1955 101/68  Pulse Rate 11/18/21 1955 71     Resp --      Temp 11/18/21 1955 98.8 F (37.1 C)     Temp Source 11/18/21 1955 Oral     SpO2 11/18/21 1955 99 %     Weight --      Height --      Head Circumference --      Peak Flow --      Pain Score 11/18/21 1958 0     Pain Loc --      Pain Edu? --      Excl. in GC? --    No data found.  Updated Vital Signs BP 101/68 (BP Location: Right Arm)   Pulse 71   Temp 98.8 F (37.1 C) (Oral)   LMP 10/28/2021   SpO2 99%   Visual Acuity Right Eye Distance:   Left Eye Distance:   Bilateral Distance:    Right Eye Near:   Left Eye Near:    Bilateral Near:     Physical Exam Vitals and nursing note reviewed.  Constitutional:      General: She is not in acute distress.    Appearance: Normal appearance.  HENT:     Head: Normocephalic.  Eyes:     Extraocular Movements: Extraocular movements intact.     Pupils:  Pupils are equal, round, and reactive to light.  Cardiovascular:     Rate and Rhythm: Normal rate and regular rhythm.     Pulses: Normal pulses.     Heart sounds: Normal heart sounds.  Pulmonary:     Effort: Pulmonary effort is normal.     Breath sounds: Normal breath sounds.  Abdominal:     General: Bowel sounds are normal.     Palpations: Abdomen is soft.  Musculoskeletal:     Left wrist: No tenderness or snuff box tenderness. Normal range of motion.     Cervical back: Normal range of motion. No rigidity.     Comments: Ganglion cyst present in the dorsal aspect of the left wrist.  Skin:    General: Skin is warm and dry.  Neurological:     General: No focal deficit present.     Mental Status: She is alert and oriented to person, place, and time.  Psychiatric:        Mood and Affect: Mood normal.        Behavior: Behavior normal.      UC Treatments / Results  Labs (all labs ordered are listed, but only abnormal results are displayed) Labs Reviewed - No data to display  EKG   Radiology No results found.  Procedures Procedures (including critical care time)  Medications Ordered in UC Medications - No data to display  Initial Impression / Assessment and Plan / UC Course  I have reviewed the triage vital signs and the nursing notes.  Pertinent labs & imaging results that were available during my care of the patient were reviewed by me and considered in my medical decision making (see chart for details).  Patient presents with a ganglion cyst of the left wrist that has been present for the past 2 to 3 weeks.  Patient denies any known injury or trauma to the left wrist, but reports that she does perform repetitive motion at her job.  Supportive care recommendations were provided to the patient to include use of the wrist brace that she currently has, over-the-counter analgesics for pain, and the use of ice for pain or swelling.  Patient  advised to follow-up with [facility] who she has seen in the past or with [facility].   Final Clinical Impressions(s) / UC Diagnoses   Final diagnoses:  Ganglion cyst of wrist, left     Discharge Instructions      Your symptoms are consistent with a ganglion cyst. May take ibuprofen or Tylenol as needed for pain or discomfort. Recommend using the wrist brace to add compression and support of the left wrist while symptoms persist. As discussed, follow-up with orthopedics for further evaluation.  You can follow-up with [facility] [place] at [phone] or [facility] at [phone].     ED Prescriptions   None    PDMP not reviewed this encounter.   Abran Cantor, NP 11/18/21 2018

## 2022-06-05 ENCOUNTER — Encounter: Payer: Self-pay | Admitting: Radiology
